# Patient Record
Sex: Female | Born: 1975 | Race: Asian | Hispanic: No | Marital: Married | Smoking: Never smoker
Health system: Southern US, Community
[De-identification: ages and names within clinical notes are randomized; demographics above are authoritative.]

## PROBLEM LIST (undated history)

## (undated) DIAGNOSIS — R202 Paresthesia of skin: Secondary | ICD-10-CM

## (undated) DIAGNOSIS — K219 Gastro-esophageal reflux disease without esophagitis: Secondary | ICD-10-CM

## (undated) DIAGNOSIS — E559 Vitamin D deficiency, unspecified: Secondary | ICD-10-CM

## (undated) DIAGNOSIS — R55 Syncope and collapse: Secondary | ICD-10-CM

## (undated) DIAGNOSIS — N6019 Diffuse cystic mastopathy of unspecified breast: Secondary | ICD-10-CM

## (undated) DIAGNOSIS — E039 Hypothyroidism, unspecified: Secondary | ICD-10-CM

## (undated) DIAGNOSIS — R87619 Unspecified abnormal cytological findings in specimens from cervix uteri: Secondary | ICD-10-CM

## (undated) DIAGNOSIS — I10 Essential (primary) hypertension: Secondary | ICD-10-CM

## (undated) DIAGNOSIS — K633 Ulcer of intestine: Secondary | ICD-10-CM

## (undated) DIAGNOSIS — M5481 Occipital neuralgia: Secondary | ICD-10-CM

## (undated) DIAGNOSIS — M62838 Other muscle spasm: Secondary | ICD-10-CM

## (undated) HISTORY — DX: Syncope and collapse: R55

## (undated) HISTORY — DX: Occipital neuralgia: M54.81

## (undated) HISTORY — DX: Gastro-esophageal reflux disease without esophagitis: K21.9

## (undated) HISTORY — DX: Vitamin D deficiency, unspecified: E55.9

## (undated) HISTORY — DX: Unspecified abnormal cytological findings in specimens from cervix uteri: R87.619

## (undated) HISTORY — PX: WISDOM TOOTH EXTRACTION: SHX21

## (undated) HISTORY — DX: Paresthesia of skin: R20.2

## (undated) HISTORY — PX: ESOPHAGOGASTRODUODENOSCOPY: SHX1529

## (undated) HISTORY — DX: Diffuse cystic mastopathy of unspecified breast: N60.19

## (undated) HISTORY — DX: Other muscle spasm: M62.838

## (undated) HISTORY — DX: Ulcer of intestine: K63.3

## (undated) HISTORY — PX: BREAST SURGERY: SHX581

---

## 1997-10-10 ENCOUNTER — Encounter: Admission: RE | Admit: 1997-10-10 | Discharge: 1998-01-08 | Payer: Self-pay | Admitting: *Deleted

## 1999-06-04 ENCOUNTER — Encounter: Payer: Self-pay | Admitting: Otolaryngology

## 1999-06-04 ENCOUNTER — Encounter: Admission: RE | Admit: 1999-06-04 | Discharge: 1999-06-04 | Payer: Self-pay | Admitting: Otolaryngology

## 1999-06-11 ENCOUNTER — Other Ambulatory Visit: Admission: RE | Admit: 1999-06-11 | Discharge: 1999-06-11 | Payer: Self-pay | Admitting: *Deleted

## 2000-01-22 ENCOUNTER — Other Ambulatory Visit: Admission: RE | Admit: 2000-01-22 | Discharge: 2000-01-22 | Payer: Self-pay | Admitting: *Deleted

## 2000-04-08 ENCOUNTER — Ambulatory Visit (HOSPITAL_COMMUNITY): Admission: RE | Admit: 2000-04-08 | Discharge: 2000-04-08 | Payer: Self-pay | Admitting: *Deleted

## 2000-04-08 ENCOUNTER — Encounter: Payer: Self-pay | Admitting: *Deleted

## 2000-07-19 ENCOUNTER — Inpatient Hospital Stay (HOSPITAL_COMMUNITY): Admission: AD | Admit: 2000-07-19 | Discharge: 2000-07-21 | Payer: Self-pay | Admitting: Obstetrics and Gynecology

## 2002-10-09 ENCOUNTER — Other Ambulatory Visit: Admission: RE | Admit: 2002-10-09 | Discharge: 2002-10-09 | Payer: Self-pay | Admitting: Obstetrics and Gynecology

## 2003-08-15 ENCOUNTER — Emergency Department (HOSPITAL_COMMUNITY): Admission: EM | Admit: 2003-08-15 | Discharge: 2003-08-16 | Payer: Self-pay | Admitting: Emergency Medicine

## 2003-11-06 ENCOUNTER — Other Ambulatory Visit: Admission: RE | Admit: 2003-11-06 | Discharge: 2003-11-06 | Payer: Self-pay | Admitting: Obstetrics and Gynecology

## 2004-11-06 ENCOUNTER — Other Ambulatory Visit: Admission: RE | Admit: 2004-11-06 | Discharge: 2004-11-06 | Payer: Self-pay | Admitting: Obstetrics and Gynecology

## 2005-12-01 ENCOUNTER — Other Ambulatory Visit: Admission: RE | Admit: 2005-12-01 | Discharge: 2005-12-01 | Payer: Self-pay | Admitting: Family Medicine

## 2007-02-03 ENCOUNTER — Other Ambulatory Visit: Admission: RE | Admit: 2007-02-03 | Discharge: 2007-02-03 | Payer: Self-pay | Admitting: Family Medicine

## 2008-01-11 ENCOUNTER — Other Ambulatory Visit: Admission: RE | Admit: 2008-01-11 | Discharge: 2008-01-11 | Payer: Self-pay | Admitting: Family Medicine

## 2009-06-11 ENCOUNTER — Other Ambulatory Visit: Admission: RE | Admit: 2009-06-11 | Discharge: 2009-06-11 | Payer: Self-pay | Admitting: Family Medicine

## 2009-07-05 ENCOUNTER — Emergency Department (HOSPITAL_BASED_OUTPATIENT_CLINIC_OR_DEPARTMENT_OTHER): Admission: EM | Admit: 2009-07-05 | Discharge: 2009-07-06 | Payer: Self-pay | Admitting: Emergency Medicine

## 2009-08-21 ENCOUNTER — Ambulatory Visit (HOSPITAL_COMMUNITY): Admission: RE | Admit: 2009-08-21 | Discharge: 2009-08-21 | Payer: Self-pay | Admitting: Gastroenterology

## 2009-08-25 ENCOUNTER — Observation Stay (HOSPITAL_COMMUNITY): Admission: EM | Admit: 2009-08-25 | Discharge: 2009-08-27 | Payer: Self-pay | Admitting: Emergency Medicine

## 2010-05-06 IMAGING — NM NM HEPATO W/GB/PHARM/[PERSON_NAME]
3 series · 13 of 13 positions shown · non-contrast
Comparison: [HOSPITAL] abdominal ultrasound 08/21/2009.

CLINICAL DATA: Abdominal pain.

NUCLEAR MEDICINE HEPATOBILIARY IMAGING WITH GALLBLADDER EF
TECHNIQUE: Sequential images of the abdomen were obtained [DATE] minutes following intravenous administration of
radiopharmaceutical.  After slow intravenous infusion of 1.04 uCg
Cholecystokinin, gallbladder ejection fraction was determined.
Radiopharmaceutical:  5.0 mCi Gc-OOm Choletec

[he hepatobiliary · 3.43mm/px · 6 of 48 frames shown (1 of 3)]
[frame 5/48]
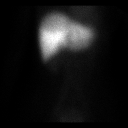
[frame 13/48]
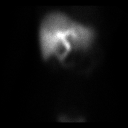
[frame 21/48]
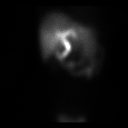
[frame 29/48]
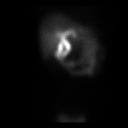
[frame 37/48]
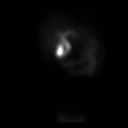
[frame 45/48]
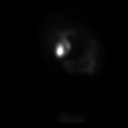

[he hepatobiliary · 3.43mm/px · 6 of 30 frames shown (2 of 3)]
[frame 3/30]
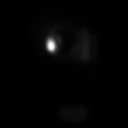
[frame 8/30]
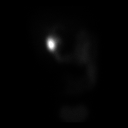
[frame 13/30]
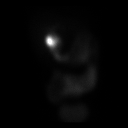
[frame 18/30]
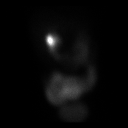
[frame 23/30]
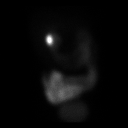
[frame 28/30]
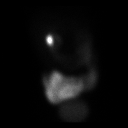

[he hepatobiliary · 1 of 1 slices shown (3 of 3)]
[im 1/1]
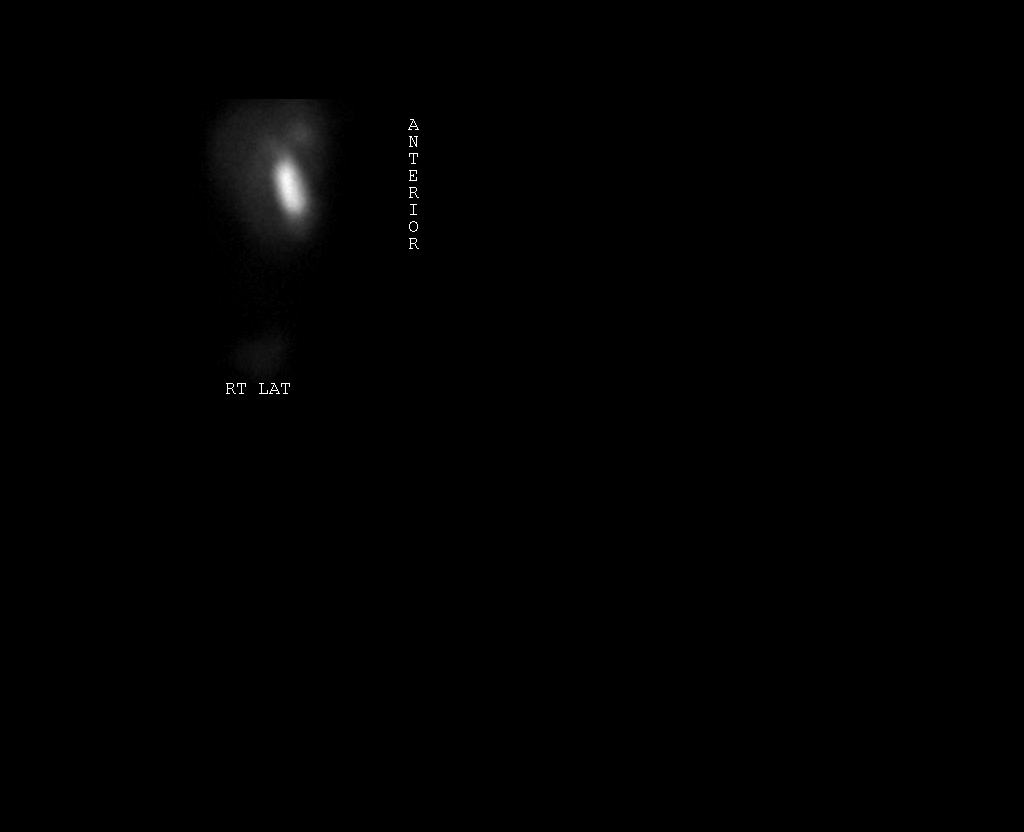

[13 of 13 positions shown; findings below may reference images not displayed]

FINDINGS: Normal hepatic uptake and excretion of tracer is seen
with gallbladder and small bowel activity visualized at 15 minutes
delay and throughout study.  Following CCK infusion gallbladder
ejection fraction is 76.2% at 30 minutes delay (normal greater than
30%).

The patient did not experience symptoms during CCK infusion.
IMPRESSION: Normal.

## 2010-05-06 IMAGING — US US ABDOMEN COMPLETE
1 series · 14 of 25 positions shown · non-contrast
Comparison: None.

CLINICAL DATA: Abdominal pain.

COMPLETE ABDOMINAL ULTRASOUND

[Series 1: us abdomen complete · 0.22mm/px · 14 of 73 slices shown]
[im 1/73]
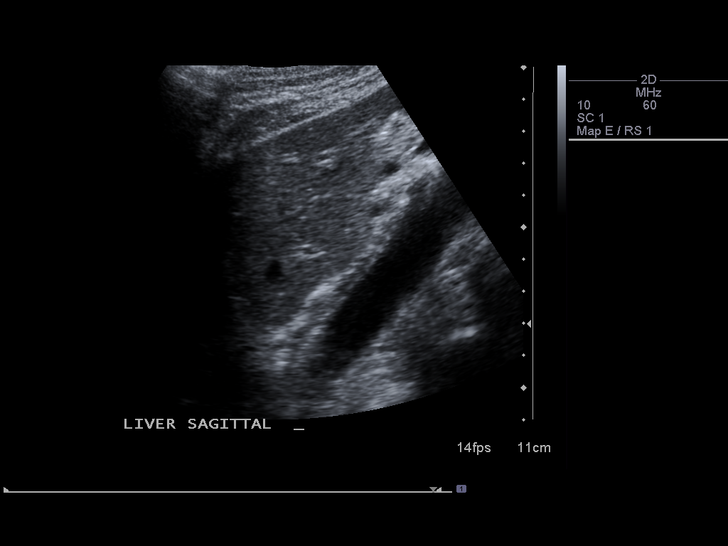
[im 7/73]
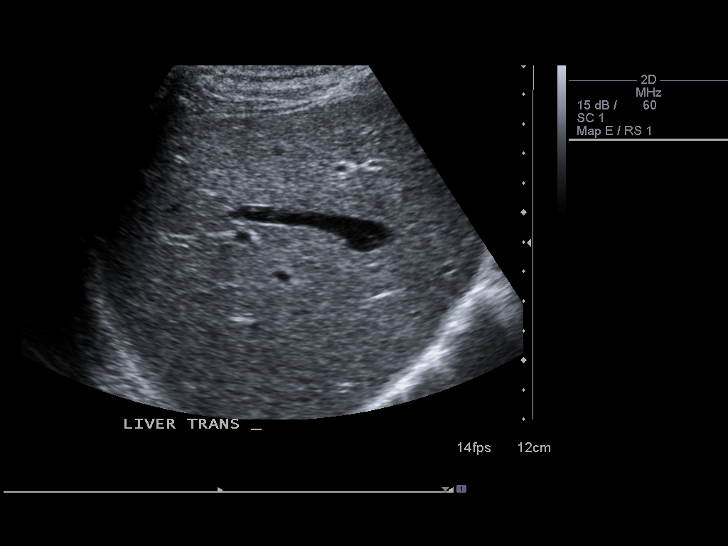
[im 13/73]
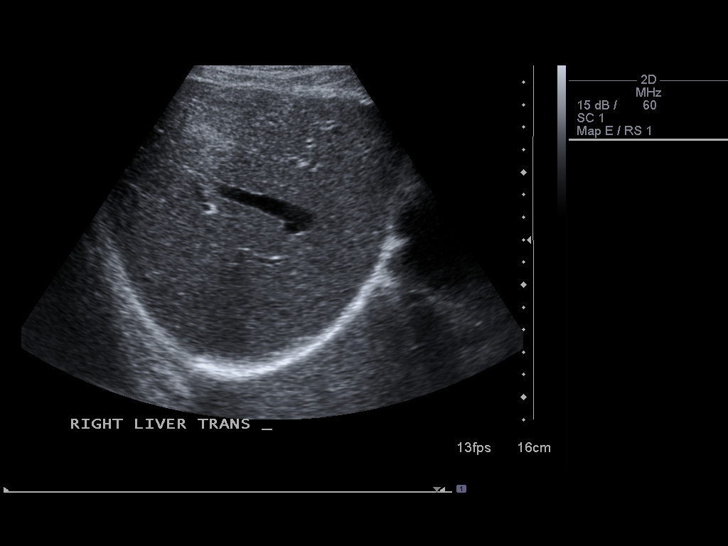
[im 19/73]
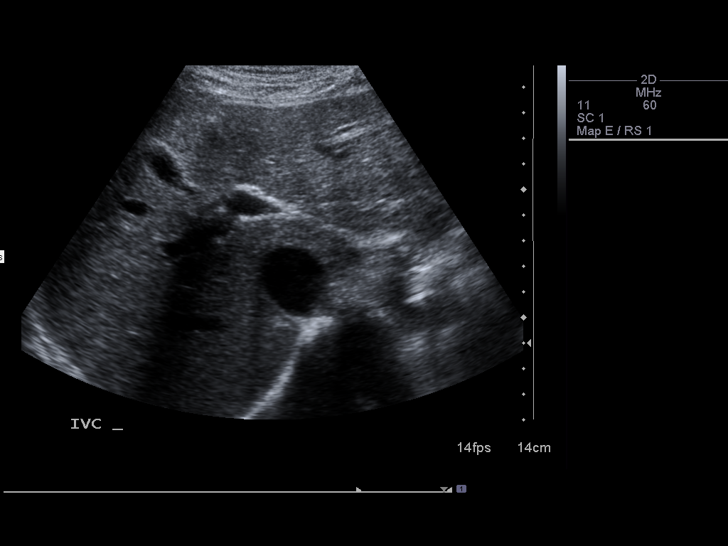
[im 25/73]
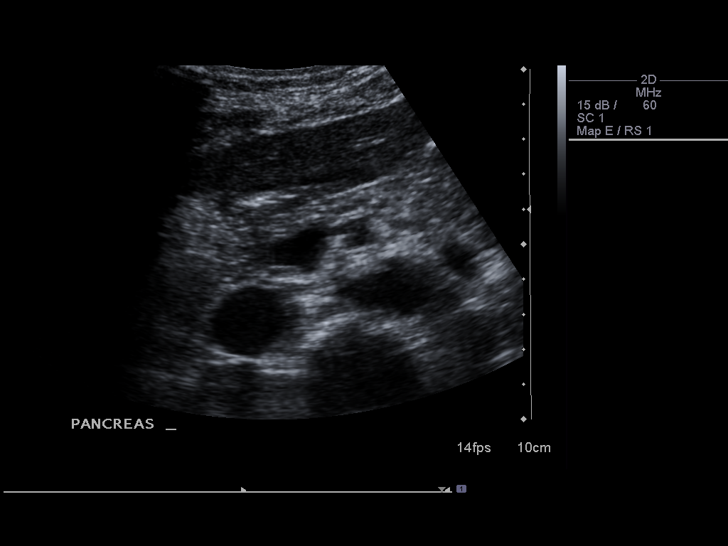
[im 28/73]
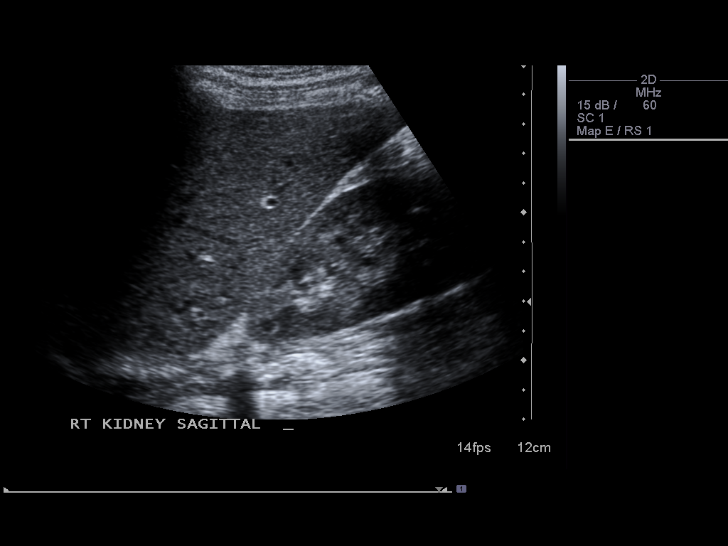
[im 34/73]
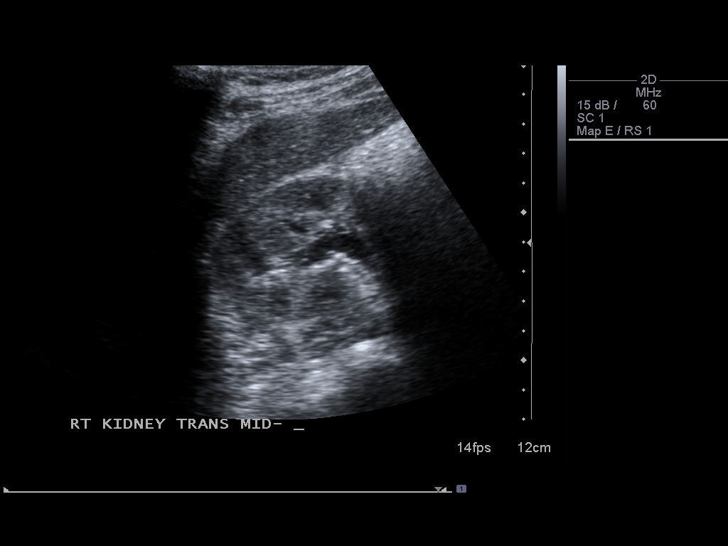
[im 40/73]
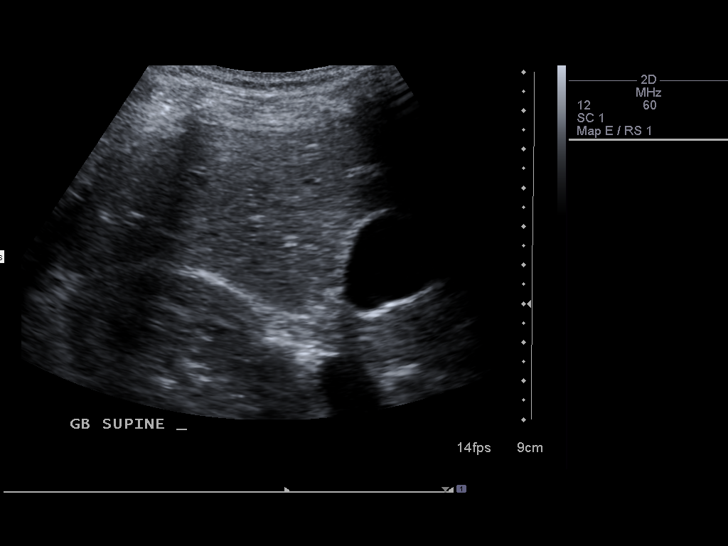
[im 46/73]
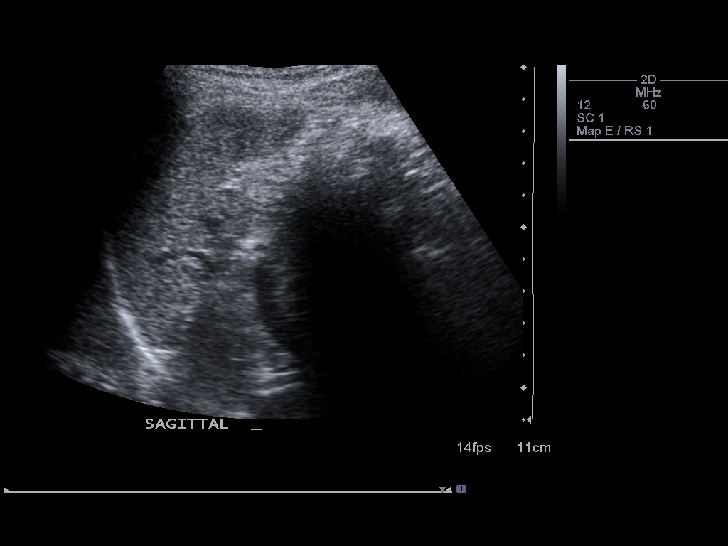
[im 49/73]
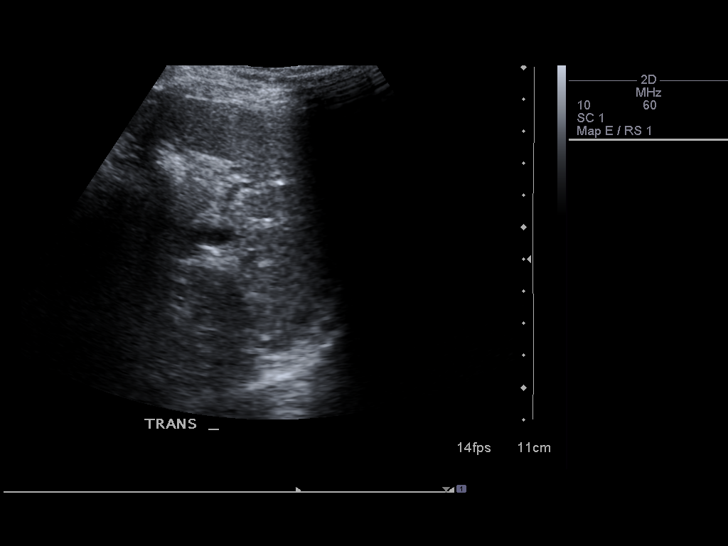
[im 55/73]
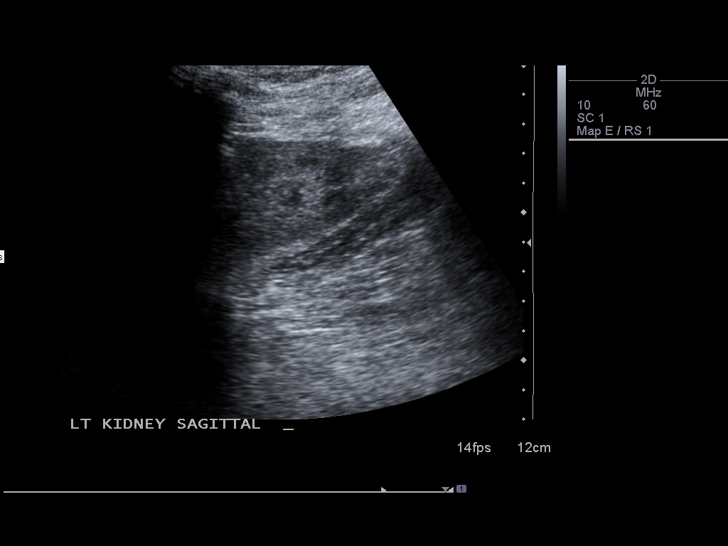
[im 61/73]
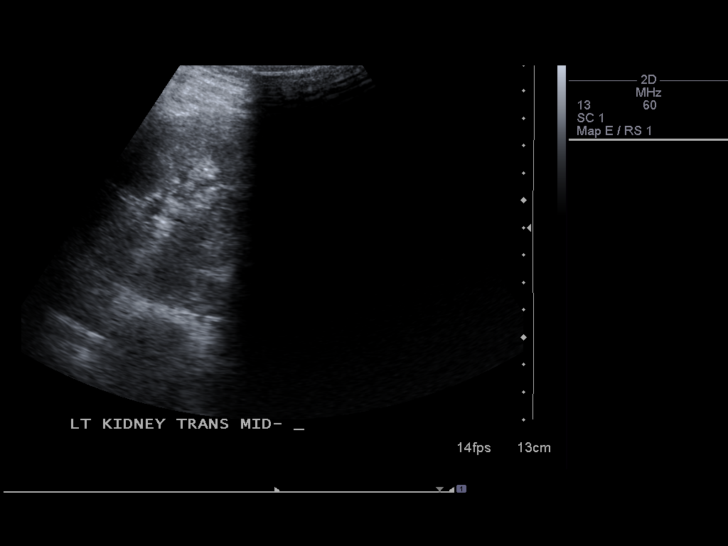
[im 67/73]
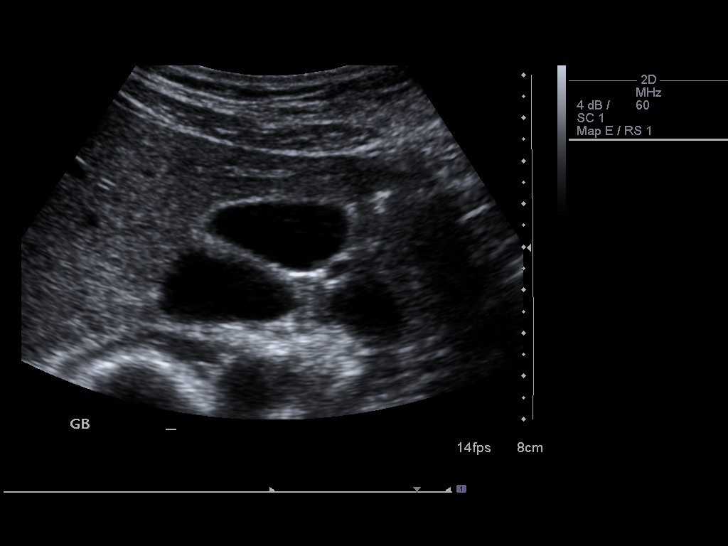
[im 73/73]
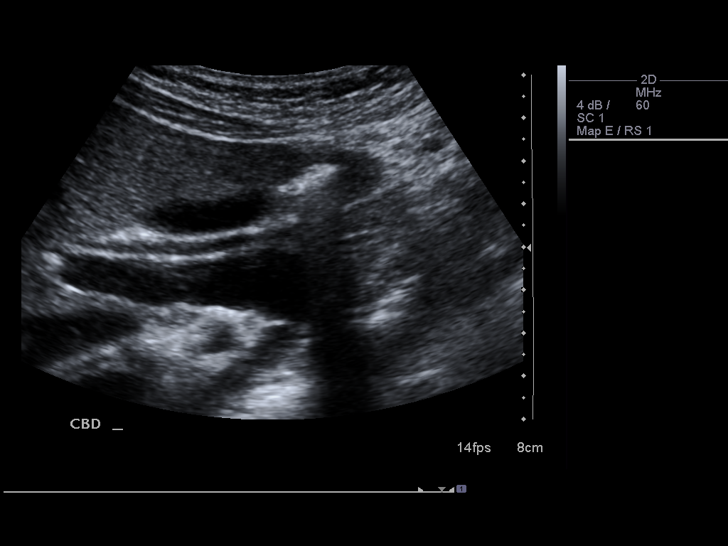

[14 of 25 positions shown; findings below may reference images not displayed]

FINDINGS: Gallbladder:  Sonographically normal without sludge or gallstones
with normal 2 mm wall thickness.

Common bile duct:  No dilated intrahepatic or extrahepatic bile
ducts with common bile duct measuring normally at 4 mm.

Liver:  Normal in size with normal echotexture with no focal lesion
identified.

IVC:  Appears normal.

Pancreas:  No focal abnormality seen.

Spleen:  Sonographically normal measuring 5.6 cm long.

Right Kidney:  Sonographically normal measuring 11.1 cm long.

Left Kidney:  Sonographically normal measuring 10.7 cm long.

Abdominal aorta:  No aneurysm identified with maximum proximal
diameter 2.2 cm.

No free fluid seen.
IMPRESSION: Normal.

## 2010-06-12 ENCOUNTER — Other Ambulatory Visit (HOSPITAL_COMMUNITY)
Admission: RE | Admit: 2010-06-12 | Discharge: 2010-06-12 | Disposition: A | Discharge status: Home / Self Care | Payer: BC Managed Care – PPO | Source: Ambulatory Visit | Attending: Family Medicine | Admitting: Family Medicine

## 2010-06-12 ENCOUNTER — Other Ambulatory Visit: Payer: Self-pay | Admitting: Family Medicine

## 2010-06-12 DIAGNOSIS — Z113 Encounter for screening for infections with a predominantly sexual mode of transmission: Secondary | ICD-10-CM | POA: Insufficient documentation

## 2010-06-12 DIAGNOSIS — Z0000 Encounter for general adult medical examination without abnormal findings: Secondary | ICD-10-CM | POA: Insufficient documentation

## 2010-07-21 LAB — CBC
HCT: 33.5 % — ABNORMAL LOW (ref 36.0–46.0)
HCT: 39 % (ref 36.0–46.0)
Hemoglobin: 11 g/dL — ABNORMAL LOW (ref 12.0–15.0)
Hemoglobin: 11.1 g/dL — ABNORMAL LOW (ref 12.0–15.0)
Hemoglobin: 12.9 g/dL (ref 12.0–15.0)
MCHC: 32.7 g/dL (ref 30.0–36.0)
MCHC: 34 g/dL (ref 30.0–36.0)
MCV: 98.1 fL (ref 78.0–100.0)
RBC: 3.37 MIL/uL — ABNORMAL LOW (ref 3.87–5.11)
RBC: 3.98 MIL/uL (ref 3.87–5.11)
RDW: 12.7 % (ref 11.5–15.5)
RDW: 13 % (ref 11.5–15.5)
WBC: 5.9 10*3/uL (ref 4.0–10.5)

## 2010-07-21 LAB — TYPE AND SCREEN

## 2010-07-21 LAB — BASIC METABOLIC PANEL
Calcium: 7.9 mg/dL — ABNORMAL LOW (ref 8.4–10.5)
GFR calc Af Amer: >60 mL/min (ref >60)
GFR calc non Af Amer: >60 mL/min (ref >60)
Sodium: 136 mEq/L (ref 135–145)

## 2010-07-21 LAB — COMPREHENSIVE METABOLIC PANEL
ALT: 16 U/L (ref 0–35)
AST: 20 U/L (ref 0–37)
CO2: 25 mEq/L (ref 19–32)
Chloride: 106 mEq/L (ref 96–112)
Creatinine, Ser: 0.59 mg/dL (ref 0.4–1.2)
GFR calc Af Amer: >60 mL/min (ref >60)
GFR calc non Af Amer: >60 mL/min (ref >60)
Glucose, Bld: 89 mg/dL (ref 70–99)
Total Bilirubin: 0.7 mg/dL (ref 0.3–1.2)

## 2010-07-21 LAB — URINALYSIS, ROUTINE W REFLEX MICROSCOPIC
Bilirubin Urine: NEGATIVE
Glucose, UA: NEGATIVE mg/dL
Hgb urine dipstick: NEGATIVE
pH: 7.5 (ref 5.0–8.0)

## 2010-07-27 LAB — CBC
Hemoglobin: 13.7 g/dL (ref 12.0–15.0)
MCHC: 34.2 g/dL (ref 30.0–36.0)
MCV: 97.3 fL (ref 78.0–100.0)
RBC: 4.13 MIL/uL (ref 3.87–5.11)

## 2010-07-27 LAB — URINE CULTURE
Colony Count: NO GROWTH
Culture: NO GROWTH

## 2010-07-27 LAB — POCT I-STAT, CHEM 8
Calcium, Ion: 1.16 mmol/L (ref 1.12–1.32)
Hemoglobin: 14.6 g/dL (ref 12.0–15.0)
Sodium: 141 mEq/L (ref 135–145)
TCO2: 25 mmol/L (ref 0–100)

## 2010-07-27 LAB — COMPREHENSIVE METABOLIC PANEL
ALT: 16 U/L (ref 0–35)
CO2: 23 mEq/L (ref 19–32)
Calcium: 6.3 mg/dL — CL (ref 8.4–10.5)
Creatinine, Ser: 0.6 mg/dL (ref 0.4–1.2)
GFR calc non Af Amer: >60 mL/min (ref >60)
Glucose, Bld: 78 mg/dL (ref 70–99)
Sodium: 146 mEq/L — ABNORMAL HIGH (ref 135–145)
Total Bilirubin: 0.8 mg/dL (ref 0.3–1.2)

## 2010-07-27 LAB — URINALYSIS, ROUTINE W REFLEX MICROSCOPIC
Ketones, ur: >80 mg/dL — AB
Nitrite: NEGATIVE
Protein, ur: 30 mg/dL — AB
Specific Gravity, Urine: 1.027 (ref 1.005–1.030)
Urobilinogen, UA: 0.2 mg/dL (ref 0.0–1.0)

## 2010-07-27 LAB — PREGNANCY, URINE: Preg Test, Ur: NEGATIVE

## 2010-07-27 LAB — LIPASE, BLOOD: Lipase: 80 U/L (ref 23–300)

## 2010-07-27 LAB — DIFFERENTIAL
Basophils Absolute: 0.1 10*3/uL (ref 0.0–0.1)
Eosinophils Absolute: 0 10*3/uL (ref 0.0–0.7)
Lymphs Abs: 0.5 10*3/uL — ABNORMAL LOW (ref 0.7–4.0)
Neutrophils Relative %: 91 % — ABNORMAL HIGH (ref 43–77)
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
## 2010-11-24 ENCOUNTER — Other Ambulatory Visit (HOSPITAL_COMMUNITY): Payer: Self-pay | Admitting: Gastroenterology

## 2010-12-07 ENCOUNTER — Encounter (HOSPITAL_COMMUNITY)
Admission: RE | Admit: 2010-12-07 | Discharge: 2010-12-07 | Disposition: A | Discharge status: Home / Self Care | Payer: BC Managed Care – PPO | Source: Ambulatory Visit | Attending: Gastroenterology | Admitting: Gastroenterology

## 2010-12-07 DIAGNOSIS — R1013 Epigastric pain: Secondary | ICD-10-CM | POA: Insufficient documentation

## 2010-12-07 DIAGNOSIS — K3189 Other diseases of stomach and duodenum: Secondary | ICD-10-CM | POA: Insufficient documentation

## 2010-12-07 DIAGNOSIS — R112 Nausea with vomiting, unspecified: Secondary | ICD-10-CM | POA: Insufficient documentation

## 2010-12-07 DIAGNOSIS — R109 Unspecified abdominal pain: Secondary | ICD-10-CM | POA: Insufficient documentation

## 2010-12-07 MED ORDER — TECHNETIUM TC 99M SULFUR COLLOID
2.0000 | Freq: Once | INTRAVENOUS | Status: AC | PRN
  Administered 2010-12-07: 2 via INTRAVENOUS

## 2010-12-21 ENCOUNTER — Encounter: Payer: Self-pay | Admitting: Internal Medicine

## 2010-12-31 ENCOUNTER — Encounter: Payer: Self-pay | Admitting: Internal Medicine

## 2011-01-01 ENCOUNTER — Ambulatory Visit (INDEPENDENT_AMBULATORY_CARE_PROVIDER_SITE_OTHER): Payer: BC Managed Care – PPO | Admitting: Internal Medicine

## 2011-01-01 ENCOUNTER — Encounter: Payer: Self-pay | Admitting: Internal Medicine

## 2011-01-01 ENCOUNTER — Encounter: Payer: Self-pay | Admitting: *Deleted

## 2011-01-01 VITALS — BP 132/82 | HR 59 | Ht 59.5 in | Wt 115.0 lb

## 2011-01-01 VITALS — BP 128/89 | HR 57

## 2011-01-01 DIAGNOSIS — R358 Other polyuria: Secondary | ICD-10-CM

## 2011-01-01 DIAGNOSIS — R55 Syncope and collapse: Secondary | ICD-10-CM | POA: Insufficient documentation

## 2011-01-01 DIAGNOSIS — R000 Tachycardia, unspecified: Secondary | ICD-10-CM

## 2011-01-01 DIAGNOSIS — R002 Palpitations: Secondary | ICD-10-CM

## 2011-01-01 DIAGNOSIS — R3589 Other polyuria: Secondary | ICD-10-CM | POA: Insufficient documentation

## 2011-01-01 NOTE — Assessment & Plan Note (Signed)
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 She also has a description of a documented tachycardia which could potentially serve as a trigger. To that end and event recorder will be necessary to clarify that.  We discussed the physiology of neurocardiogenic syncope. Her epiphenomena are consistent with this. She has orthostatic intolerance and shower intolerance. Interestingly she also describes polyuria and I wonder whether there may not be something metabolic going on whereby she is chronically volume deplete. All after followup with her PCP about this.  Given the amount of volume that she takes in she may be choronically volume deplete

## 2011-01-01 NOTE — Assessment & Plan Note (Signed)
As above.

## 2011-01-01 NOTE — Patient Instructions (Signed)
Your physician recommends that you schedule a follow-up appointment in: 2 months per Dr. Graciela Husbands   Your physician has recommended that you wear an event monitor DX 785.0 . Event monitors are medical devices that record the heart's electrical activity. Doctors most often [place] these monitors to diagnose arrhythmias. Arrhythmias are problems with the speed or rhythm of the heartbeat. The monitor is a small, portable device. You can wear one while you do your normal daily activities. This is usually used to diagnose what is causing palpitations/syncope (passing out).  Your physician has recommended you make the following change in your medication: START THERMOTABS OTC

## 2011-01-01 NOTE — Assessment & Plan Note (Signed)
These either represent tachycardia or the adrenergic surge and sinus tachycardia that precedes sympathetic withdrawal

## 2011-01-01 NOTE — Progress Notes (Signed)
HPI: Carolyn Mccall is a 35 y.o. female Seen at the request of Dr. Cliffton Asters because of recurrent syncope.  She has a long-standing history of syncope and presyncope. The first episode that she recalls occurred while she was being seen vigorously with her 34-year-old daughter 17 years ago. She has had syncope most recently while in the dentist's office when they were doing dental work with incomplete local anesthesia.  At that time apparently she was told her heart rate was 300 or so.   Many of her spells artifact associated with tachypalpitations.  She also has a history of shower and tolerance and heat and tolerance as well as orthostatic intolerance. This has been true for a long time. Her diet is salt deplete as well as fluid deplete.  She admits to significant amount of psychosocial stress.    She denies the use of marijuana or significant alcohol.  She apparently drinks a great deal. She urinates frequently and she says high volumes. This has not yet been investigated   Current Outpatient Prescriptions  Medication Sig Dispense Refill  . dexlansoprazole (DEXILANT) 60 MG capsule Take 60 mg by mouth daily.        Marland Kitchen ibuprofen (ADVIL,MOTRIN) 200 MG tablet Take 200 mg by mouth every 6 (six) hours as needed.        Marland Kitchen levonorgestrel (MIRENA) 20 MCG/24HR IUD 1 each by Intrauterine route once. As directed         No Known Allergies  Past Medical History  Diagnosis Date  . Syncope   . Migraine   . Fibrocystic breast   . Vitamin D deficiency   . Acid reflux     No past surgical history on file.  No family history on file.  History   Social History  . Marital Status: Married    Spouse Name: N/A    Number of Children: N/A  . Years of Education: N/A   Occupational History  . Not on file.   Social History Main Topics  . Smoking status: Never Smoker   . Smokeless tobacco: Not on file  . Alcohol Use: No  . Drug Use: No  . Sexually Active: Not on file   Other Topics Concern  .  Not on file   Social History Narrative  . No narrative on file    Fourteen point review of systems was negative except as noted in HPI and PMH   PHYSICAL EXAMINATION  There were no vitals taken for this visit.   Well developed and nourished young Asian woman appearing her stated age n no acute distress HENT normal Neck supple with JVP-flat Carotids brisk and full without bruits Back without scoliosis or kyphosis Clear Regular rate and rhythm, no murmurs or gallops Abd-soft with active BS without hepatomegaly or midline pulsation Femoral pulses 2+ distal pulses intact No Clubbing cyanosis edema Skin-warm and dry LN-neg submandibular and supraclavicular A & Oriented CN 3-12 normal  Grossly normal sensory and motor function Affect engaging .

## 2011-01-12 ENCOUNTER — Encounter (INDEPENDENT_AMBULATORY_CARE_PROVIDER_SITE_OTHER): Payer: BC Managed Care – PPO

## 2011-01-12 DIAGNOSIS — I471 Supraventricular tachycardia: Secondary | ICD-10-CM

## 2011-02-18 ENCOUNTER — Telehealth: Payer: Self-pay | Admitting: *Deleted

## 2011-02-18 NOTE — Telephone Encounter (Signed)
The patient is aware of her monitor results.

## 2011-03-23 ENCOUNTER — Ambulatory Visit (INDEPENDENT_AMBULATORY_CARE_PROVIDER_SITE_OTHER): Payer: BC Managed Care – PPO | Admitting: Internal Medicine

## 2011-03-23 ENCOUNTER — Encounter: Payer: Self-pay | Admitting: Internal Medicine

## 2011-03-23 DIAGNOSIS — R3589 Other polyuria: Secondary | ICD-10-CM

## 2011-03-23 DIAGNOSIS — R358 Other polyuria: Secondary | ICD-10-CM

## 2011-03-23 DIAGNOSIS — R55 Syncope and collapse: Secondary | ICD-10-CM

## 2011-03-23 DIAGNOSIS — R002 Palpitations: Secondary | ICD-10-CM

## 2011-03-23 NOTE — Assessment & Plan Note (Signed)
I suspect these are PVCs and the residual fatigue as a dysautonomic response to them. Unfortunately her event recorder did not clarify this for [place]

## 2011-03-23 NOTE — Assessment & Plan Note (Signed)
We reviewed the physiology again. We discussed the alternatives of Florinef versus salt supplementation versus measuring salt excretion. We will try to middle-As she has had such a striking benefit so far to table salt addition  In the event that the heat brings worsening symptoms, we will consider the addition of Florinef

## 2011-03-23 NOTE — Progress Notes (Signed)
  HPI  Carolyn Mccall is a 35 y.o. 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 She is less lightheadedness. She has increased her sodium intake; she decided not to take the thermo tabs salt supplements.  She also has episodes where she feels like her heart flutters for just a moment and then is left with significant residual fatigue for about 10 or 15 minutes  Past Medical History  Diagnosis Date  . Syncope   . Migraine   . Fibrocystic breast   . Vitamin D deficiency   . Acid reflux     No past surgical history on file.  Current Outpatient Prescriptions  Medication Sig Dispense Refill  . acetaminophen (TYLENOL) 325 MG tablet Take 650 mg by mouth every 6 (six) hours as needed.        Marland Kitchen dexlansoprazole (DEXILANT) 60 MG capsule Take 60 mg by mouth daily.        Marland Kitchen levonorgestrel (MIRENA) 20 MCG/24HR IUD 1 each by Intrauterine route once. As directed         No Known Allergies  Review of Systems negative except from HPI and PMH  Physical Exam Well developed and well nourished in no acute distress HENT normal E scleral and icterus clear Neck Supple JVP flat; carotids brisk and full Clear to ausculation Regular rate and rhythm, no murmurs gallops or rub Soft with active bowel sounds No clubbing cyanosis and edema Alert and oriented, grossly normal motor and sensory function Skin Warm and Dry    Assessment and  Plan

## 2011-03-23 NOTE — Assessment & Plan Note (Signed)
She also has some dysuria and I suggested she follow up with her PCP

## 2011-03-23 NOTE — Patient Instructions (Signed)
Your physician wants you to follow-up in: 8 months (July 2013) with Dr. Graciela Husbands. You will receive a reminder letter in the mail two months in advance. If you don't receive a letter, please call our office to schedule the follow-up appointment.  Your physician recommends that you continue on your current medications as directed. Please refer to the Current Medication list given to you today.

## 2011-06-17 ENCOUNTER — Other Ambulatory Visit: Payer: Self-pay | Admitting: Family Medicine

## 2011-06-17 ENCOUNTER — Telehealth: Payer: Self-pay | Admitting: Internal Medicine

## 2011-06-17 ENCOUNTER — Other Ambulatory Visit (HOSPITAL_COMMUNITY)
Admission: RE | Admit: 2011-06-17 | Discharge: 2011-06-17 | Disposition: A | Discharge status: Home / Self Care | Payer: BC Managed Care – PPO | Source: Ambulatory Visit | Attending: Family Medicine | Admitting: Family Medicine

## 2011-06-17 DIAGNOSIS — R87619 Unspecified abnormal cytological findings in specimens from cervix uteri: Secondary | ICD-10-CM | POA: Insufficient documentation

## 2011-06-17 NOTE — Telephone Encounter (Signed)
LOV,Monotor,12 faxed to [facility] @ [phone] 06/17/11/KM

## 2011-08-15 ENCOUNTER — Encounter (HOSPITAL_COMMUNITY): Payer: Self-pay | Admitting: Nurse Practitioner

## 2011-08-15 ENCOUNTER — Emergency Department (HOSPITAL_COMMUNITY)
Admission: EM | Admit: 2011-08-15 | Discharge: 2011-08-15 | Disposition: A | Discharge status: Home / Self Care | Payer: BC Managed Care – PPO | Attending: Emergency Medicine | Admitting: Emergency Medicine

## 2011-08-15 DIAGNOSIS — R55 Syncope and collapse: Secondary | ICD-10-CM

## 2011-08-15 DIAGNOSIS — I498 Other specified cardiac arrhythmias: Secondary | ICD-10-CM | POA: Insufficient documentation

## 2011-08-15 DIAGNOSIS — R002 Palpitations: Secondary | ICD-10-CM | POA: Insufficient documentation

## 2011-08-15 DIAGNOSIS — R079 Chest pain, unspecified: Secondary | ICD-10-CM | POA: Insufficient documentation

## 2011-08-15 DIAGNOSIS — R209 Unspecified disturbances of skin sensation: Secondary | ICD-10-CM | POA: Insufficient documentation

## 2011-08-15 LAB — POCT I-STAT, CHEM 8
BUN: 6 mg/dL (ref 6–23)
Calcium, Ion: 1.15 mmol/L (ref 1.12–1.32)
Creatinine, Ser: 0.7 mg/dL (ref 0.50–1.10)
Hemoglobin: 12.9 g/dL (ref 12.0–15.0)
TCO2: 25 mmol/L (ref 0–100)

## 2011-08-15 LAB — CBC
HCT: 37.1 % (ref 36.0–46.0)
MCH: 32.1 pg (ref 26.0–34.0)
MCV: 94.6 fL (ref 78.0–100.0)
RDW: 12.1 % (ref 11.5–15.5)
WBC: 6.7 10*3/uL (ref 4.0–10.5)

## 2011-08-15 LAB — BASIC METABOLIC PANEL
BUN: 7 mg/dL (ref 6–23)
Calcium: 8.3 mg/dL — ABNORMAL LOW (ref 8.4–10.5)
Chloride: 105 mEq/L (ref 96–112)
Creatinine, Ser: 0.62 mg/dL (ref 0.50–1.10)
GFR calc Af Amer: >90 mL/min (ref >90)

## 2011-08-15 NOTE — ED Notes (Signed)
Per ems: pt called ems for headache, blurred vision, nasuea, chest pain, sweats, numbness to L side of body onset while at rest this afternoon. On arrival to scene, ems found pt in NSR with unremarkable 12 lead, administered 324 mg asa, started PIV. En route, VSS, A&Ox4. En route pt reports feeling better but mild headache and dizziness remains

## 2011-08-15 NOTE — Discharge Instructions (Signed)
Follow up with your doctor/cardiologist in coming week. Return to ER if worse, symptoms recur, fainting episodes, chest pain, severe headache, other concern.        Palpitations  A palpitation is the feeling that your heartbeat is irregular or is faster than normal. Although this is frightening, it usually is not serious. Palpitations may be caused by excesses of smoking, caffeine, or alcohol. They are also brought on by stress and anxiety. Sometimes, they are caused by heart disease. Unless otherwise noted, your caregiver did not find any signs of serious illness at this time. HOME CARE INSTRUCTIONS  To help prevent palpitations:  Drink decaffeinated coffee, tea, and soda pop. Avoid chocolate.   If you smoke or drink alcohol, quit or cut down as much as possible.   Reduce your stress or anxiety level. Biofeedback, yoga, or meditation will help you relax. Physical activity such as swimming, jogging, or walking also may be helpful.  SEEK MEDICAL CARE IF:   You continue to have a fast heartbeat.   Your palpitations occur more often.  SEEK IMMEDIATE MEDICAL CARE IF: You develop chest pain, shortness of breath, severe headache, dizziness, or fainting. Document Released: 04/16/2000 Document Revised: 04/08/2011 Document Reviewed: 06/16/2007 [facility] Patient Information 2012 [place], [place].

## 2011-08-15 NOTE — ED Notes (Signed)
No voiced complaints presently. NAD. Informed patient and/or family of status. Continues NSR on monitor. Denies CP, h/a, dizziness. States she just feels tired.

## 2011-08-15 NOTE — ED Provider Notes (Signed)
History     CSN: [id number]  Arrival date & time 08/15/11  1437   First MD Initiated Contact with Patient 08/15/11 1441      Chief Complaint  Patient presents with  . Chest Pain    (Consider location/radiation/quality/duration/timing/severity/associated sxs/prior treatment) Patient is a 36 y.o. female presenting with chest pain. The history is provided by the patient.  Chest Pain Primary symptoms include palpitations. Pertinent negatives for primary symptoms include no fever, no shortness of breath, no abdominal pain and no vomiting.  The palpitations did not occur with shortness of breath.   pt states had onset palpitations, feeling as if heart was racing. Then felt warm/flushed. Felt anxious. Then noted numbness/tingling hands and around chin/mouth. Also noted gradual onset mild headache, diffuse, dull, states not as severe as prior migraines. No neck pain or stiffness. No assoc unilateral numbness/weakness. No change in speech. Pt says eventually felt lightheaded as if may faint, but denies loc. States hx similar episodes in past-  Has seen cardiologist, Graciela Husbands,  - states no hx heart disease or dysrhythmia. Lasted several minutes. Feels fine now.     Past Medical History  Diagnosis Date  . Syncope   . Migraine   . Fibrocystic breast   . Vitamin d deficiency   . Acid reflux     History reviewed. No pertinent past surgical history.  History reviewed. No pertinent family history.  History  Substance Use Topics  . Smoking status: Never Smoker   . Smokeless tobacco: Not on file  . Alcohol Use: Yes     soCIAL    OB History    Grav Para Term Preterm Abortions TAB SAB Ect Mult Living                  Review of Systems  Constitutional: Negative for fever and chills.  HENT: Negative for congestion and neck pain.   Eyes: Negative for pain and redness.  Respiratory: Negative for shortness of breath.   Cardiovascular: Positive for palpitations. Negative for chest pain and  leg swelling.  Gastrointestinal: Negative for vomiting, abdominal pain and diarrhea.  Genitourinary: Negative for flank pain.  Musculoskeletal: Negative for back pain.  Skin: Negative for rash.  Neurological: Negative for headaches.  Hematological: Does not bruise/bleed easily.  Psychiatric/Behavioral: Negative for confusion.    Allergies  Review of patient's allergies indicates no known allergies.  Home Medications   Current Outpatient Rx  Name Route Sig Dispense Refill  . ACETAMINOPHEN 500 MG PO TABS Oral Take 1,000 mg by mouth every 6 (six) hours as needed. For pain    . DEXLANSOPRAZOLE 60 MG PO CPDR Oral Take 60 mg by mouth daily.      Marland Kitchen LEVONORGESTREL 20 MCG/24HR IU IUD Intrauterine 1 each by Intrauterine route once. As directed       BP 146/63  Pulse 59  Temp 97.9 F (36.6 C)  Resp 16  Ht 5' (1.524 m)  Wt 119 lb (53.978 kg)  BMI 23.24 kg/m2  SpO2 100%  Physical Exam  Nursing note and vitals reviewed. Constitutional: She is oriented to person, place, and time. She appears well-developed and well-nourished. No distress.  HENT:  Head: Atraumatic.  Nose: Nose normal.  Mouth/Throat: Oropharynx is clear and moist.       No sinus pain/tenderness.   Eyes: Conjunctivae are normal. Pupils are equal, round, and reactive to light. No scleral icterus.  Neck: Normal range of motion. Neck supple. No tracheal deviation present.  Cardiovascular: Normal rate,  regular rhythm, normal heart sounds and intact distal pulses.  Exam reveals no gallop and no friction rub.   No murmur heard. Pulmonary/Chest: Effort normal and breath sounds normal. No respiratory distress.  Abdominal: Soft. Normal appearance and bowel sounds are normal. She exhibits no distension. There is no tenderness.  Musculoskeletal: She exhibits no edema and no tenderness.  Neurological: She is alert and oriented to person, place, and time. No cranial nerve deficit.       Motor intact bil. Steady gait.   Skin: Skin  is warm and dry. No rash noted.  Psychiatric: She has a normal mood and affect.    ED Course  Procedures (including critical care time)   Results for orders placed during the hospital encounter of 08/15/11  CBC      Component Value Range   WBC 6.7  4.0 - 10.5 (K/uL)   RBC 3.92  3.87 - 5.11 (MIL/uL)   Hemoglobin 12.6  12.0 - 15.0 (g/dL)   HCT 16.1  09.6 - 04.5 (%)   MCV 94.6  78.0 - 100.0 (fL)   MCH 32.1  26.0 - 34.0 (pg)   MCHC 34.0  30.0 - 36.0 (g/dL)   RDW 40.9  81.1 - 91.4 (%)   Platelets 191  150 - 400 (K/uL)  POCT I-STAT, CHEM 8      Component Value Range   Sodium 141  135 - 145 (mEq/L)   Potassium 3.7  3.5 - 5.1 (mEq/L)   Chloride 107  96 - 112 (mEq/L)   BUN 6  6 - 23 (mg/dL)   Creatinine, Ser 7.82  0.50 - 1.10 (mg/dL)   Glucose, Bld 93  70 - 99 (mg/dL)   Calcium, Ion 9.56  2.13 - 1.32 (mmol/L)   TCO2 25  0 - 100 (mmol/L)   Hemoglobin 12.9  12.0 - 15.0 (g/dL)   HCT 08.6  57.8 - 46.9 (%)       MDM  Monitor, ecg. Labs. Currently asymptomatic.      Date: 08/15/2011  Rate: 58  Rhythm: sinus bradycardia  QRS Axis: normal  Intervals: normal  ST/T Wave abnormalities: nonspecific ST/T changes  Conduction Disutrbances:none  Narrative Interpretation:   Old EKG Reviewed: unchanged  Labs normal. Remains in nsr on monitor, no dysrhythmia.  Pt has had extensive cardiology eval in past, including holters - they feel possibly neurocardiogenic near syncope/syncope.  Pt stable for d/c, will have f/u closely with her doctors.     Suzi Roots, MD 08/15/11 [phone]

## 2011-09-07 ENCOUNTER — Ambulatory Visit: Payer: BC Managed Care – PPO | Admitting: Physician Assistant

## 2011-09-09 ENCOUNTER — Ambulatory Visit: Payer: BC Managed Care – PPO | Admitting: Physician Assistant

## 2011-09-10 ENCOUNTER — Ambulatory Visit (INDEPENDENT_AMBULATORY_CARE_PROVIDER_SITE_OTHER): Payer: BC Managed Care – PPO | Admitting: Physician Assistant

## 2011-09-10 ENCOUNTER — Encounter: Payer: Self-pay | Admitting: Physician Assistant

## 2011-09-10 VITALS — BP 145/92 | HR 64 | Ht 60.0 in | Wt 116.8 lb

## 2011-09-10 DIAGNOSIS — R002 Palpitations: Secondary | ICD-10-CM

## 2011-09-10 DIAGNOSIS — R55 Syncope and collapse: Secondary | ICD-10-CM

## 2011-09-10 DIAGNOSIS — IMO0001 Reserved for inherently not codable concepts without codable children: Secondary | ICD-10-CM

## 2011-09-10 DIAGNOSIS — R030 Elevated blood-pressure reading, without diagnosis of hypertension: Secondary | ICD-10-CM

## 2011-09-10 NOTE — Patient Instructions (Signed)
Your physician has requested that you have an echocardiogram DX PALPITATIONS, SYNCOPE. Echocardiography is a painless test that uses sound waves to create images of your heart. It provides your doctor with information about the size and shape of your heart and how well your heart's chambers and valves are working. This procedure takes approximately one hour. There are no restrictions for this procedure.  Your physician has requested that you have a stress echocardiogram DX PALPITATIONS, SYNCOPE.. For further information please visit [email or web address] Please follow instruction sheet as given.   Your physician recommends that you schedule a follow-up appointment in: 3-4 WEEKS WITH DR. Graciela Husbands

## 2011-09-10 NOTE — Progress Notes (Signed)
[address] [place], [place]  [address] Phone: [phone] Fax:  [phone]  Date:  09/10/2011   Name:  Carolyn Mccall   DOB:  1975-05-19   MRN:  [id number]  PCP:  Cala Bradford, MD, MD  Primary Cardiologist/Primary Electrophysiologist:  Dr. Sherryl Manges    History of Present Illness: Carolyn Mccall is a 36 y.o. female who returns for evaluation of syncope.  She has a history of syncope presumed to be from dysautonomia.  Prior event recorder demonstrated sinus rhythm correlating with tachycardia palpitations.  Last seen by Dr. Graciela Husbands 03/2011.  Current therapy has been increased salt in her diet.  He felt that in the event that she has worsening symptoms Florinef could be had her medications.  Patient experienced a syncopal episode one month ago in the setting of increased stress.  She went to the emergency room.  CBC and basic metabolic panel were both normal.  She followed up with her PCP who referred her back here.  Her job is very stressful.  She is also having difficulty with her ex-husband.  She developed heaviness in the back of her neck with increased palpitations and lightheadedness prior to her syncopal spell.  This was very similar to her prior episodes of syncope.  She has palpitations throughout most of the week.  Occasional left-sided chest pain.  She denies significant dyspnea.  She does note that her heart rate increases with going up steps.  No orthopnea, PND, edema.     Potassium  Date/Time Value Range Status  08/15/2011  4:52 PM 3.7  3.5-5.1 (mEq/L) Final     Creatinine, Ser  Date/Time Value Range Status  08/15/2011  4:52 PM 0.70  0.50-1.10 (mg/dL) Final     ALT  Date/Time Value Range Status  08/25/2009  3:10 PM 16  0-35 (U/L) Final     Hemoglobin  Date/Time Value Range Status  08/15/2011  4:52 PM 12.9  12.0-15.0 (g/dL) Final    Past Medical History  Diagnosis Date  . Syncope   . Migraine   . Fibrocystic breast   . Vitamin d  deficiency   . Acid reflux     Current Outpatient Prescriptions  Medication Sig Dispense Refill  . acetaminophen (TYLENOL) 500 MG tablet Take 1,000 mg by mouth every 6 (six) hours as needed. For pain      . cyclobenzaprine (FLEXERIL) 5 MG tablet Take 5 mg by mouth at bedtime.      Marland Kitchen dexlansoprazole (DEXILANT) 60 MG capsule Take 60 mg by mouth daily.        Marland Kitchen levonorgestrel (MIRENA) 20 MCG/24HR IUD 1 each by Intrauterine route once. As directed       . polyethylene glycol (MIRALAX / GLYCOLAX) packet Take 17 g by mouth as needed.        Allergies: No Known Allergies  History  Substance Use Topics  . Smoking status: Never Smoker   . Smokeless tobacco: Not on file  . Alcohol Use: Yes     soCIAL     ROS:  Please see the history of present illness.    All other systems reviewed and negative.   PHYSICAL EXAM: VS:  BP 145/92  Pulse 64  Ht 5' (1.524 m)  Wt 116 lb 12.8 oz (52.98 kg)  BMI 22.81 kg/m2  Filed Vitals:   09/10/11 1144 09/10/11 1145 09/10/11 1146 09/10/11 1147  BP: 146/94 160/93 145/95 145/92  Pulse: 69 67 67 64  Height:  Weight:         Well nourished, well developed, in no acute distress HEENT: normal Neck: no JVD Cardiac:  normal S1, S2; RRR; no murmur Lungs:  clear to auscultation bilaterally, no wheezing, rhonchi or rales Abd: soft, nontender, no hepatomegaly Ext: no edema Skin: warm and dry Neuro:  CNs 2-12 intact, no focal abnormalities noted  EKG:  Sinus rhythm, rate 66, normal axis, no change from prior tracing   ASSESSMENT AND PLAN:  1.  Syncope   -  Neurocardiogenic Syncope.  She has had prior event monitor.  Suspect anxiety playing a large role.  BP now running on high side.     -  I have asked her to decrease salt slightly as BP now running high.  No orthostatic BP drop today.  Would not start Florinef with how her BP is now.   -  Continue to push fluids.    -  Arrange Echo and ETT-echo.   -  Follow up with Dr. Sherryl Manges   2.   Palpitations   -  Resting HR 50-60s.  She has had event monitor previously.   -  Not sure she could tolerate daily beta blocker.    -  Obtain echo and ETT-echo for now.   -  Follow up with Dr. Graciela Husbands  3.  Chest Pain   -  Atypical.  Will check ETT-echo.  4.  Elevated BP   -  Decrease salt somewhat.   -  Eliminate caffeine.  5.  Anxiety   -  Advised her to seek counseling to help with stress.   Signed, Tereso Newcomer, PA-C  10:55 AM 09/10/2011

## 2011-09-20 ENCOUNTER — Ambulatory Visit (HOSPITAL_COMMUNITY): Payer: BC Managed Care – PPO | Attending: Internal Medicine

## 2011-09-20 ENCOUNTER — Other Ambulatory Visit: Payer: Self-pay

## 2011-09-20 DIAGNOSIS — Z978 Presence of other specified devices: Secondary | ICD-10-CM | POA: Insufficient documentation

## 2011-09-20 DIAGNOSIS — R030 Elevated blood-pressure reading, without diagnosis of hypertension: Secondary | ICD-10-CM | POA: Insufficient documentation

## 2011-09-20 DIAGNOSIS — G909 Disorder of the autonomic nervous system, unspecified: Secondary | ICD-10-CM | POA: Insufficient documentation

## 2011-09-20 DIAGNOSIS — R55 Syncope and collapse: Secondary | ICD-10-CM

## 2011-09-20 DIAGNOSIS — IMO0001 Reserved for inherently not codable concepts without codable children: Secondary | ICD-10-CM

## 2011-09-20 DIAGNOSIS — R002 Palpitations: Secondary | ICD-10-CM

## 2011-09-28 ENCOUNTER — Encounter: Payer: Self-pay | Admitting: Internal Medicine

## 2011-09-28 ENCOUNTER — Ambulatory Visit (HOSPITAL_COMMUNITY): Payer: BC Managed Care – PPO | Attending: Cardiology

## 2011-09-28 DIAGNOSIS — IMO0001 Reserved for inherently not codable concepts without codable children: Secondary | ICD-10-CM

## 2011-09-28 DIAGNOSIS — R55 Syncope and collapse: Secondary | ICD-10-CM

## 2011-09-28 DIAGNOSIS — R002 Palpitations: Secondary | ICD-10-CM

## 2011-10-11 ENCOUNTER — Ambulatory Visit: Payer: BC Managed Care – PPO | Admitting: Internal Medicine

## 2011-10-12 ENCOUNTER — Encounter: Payer: Self-pay | Admitting: *Deleted

## 2011-10-26 ENCOUNTER — Other Ambulatory Visit: Payer: Self-pay | Admitting: Family Medicine

## 2011-10-26 DIAGNOSIS — N644 Mastodynia: Secondary | ICD-10-CM

## 2011-10-27 ENCOUNTER — Encounter: Payer: Self-pay | Admitting: Internal Medicine

## 2011-11-09 ENCOUNTER — Ambulatory Visit
Admission: RE | Admit: 2011-11-09 | Discharge: 2011-11-09 | Disposition: A | Discharge status: Home / Self Care | Payer: BC Managed Care – PPO | Source: Ambulatory Visit | Attending: Family Medicine | Admitting: Family Medicine

## 2011-11-09 DIAGNOSIS — N644 Mastodynia: Secondary | ICD-10-CM

## 2011-12-10 ENCOUNTER — Other Ambulatory Visit: Payer: Self-pay | Admitting: Family Medicine

## 2011-12-10 ENCOUNTER — Other Ambulatory Visit (HOSPITAL_COMMUNITY)
Admission: RE | Admit: 2011-12-10 | Discharge: 2011-12-10 | Disposition: A | Discharge status: Home / Self Care | Payer: BC Managed Care – PPO | Source: Ambulatory Visit | Attending: Family Medicine | Admitting: Family Medicine

## 2011-12-10 DIAGNOSIS — Z01419 Encounter for gynecological examination (general) (routine) without abnormal findings: Secondary | ICD-10-CM | POA: Insufficient documentation

## 2012-02-24 ENCOUNTER — Other Ambulatory Visit: Payer: Self-pay | Admitting: Family Medicine

## 2012-02-24 DIAGNOSIS — R42 Dizziness and giddiness: Secondary | ICD-10-CM

## 2012-02-24 DIAGNOSIS — R51 Headache: Secondary | ICD-10-CM

## 2012-02-28 ENCOUNTER — Ambulatory Visit
Admission: RE | Admit: 2012-02-28 | Discharge: 2012-02-28 | Disposition: A | Discharge status: Home / Self Care | Payer: BC Managed Care – PPO | Source: Ambulatory Visit | Attending: Family Medicine | Admitting: Family Medicine

## 2012-02-28 DIAGNOSIS — R51 Headache: Secondary | ICD-10-CM

## 2012-02-28 DIAGNOSIS — R42 Dizziness and giddiness: Secondary | ICD-10-CM

## 2012-03-14 ENCOUNTER — Telehealth: Payer: Self-pay | Admitting: Internal Medicine

## 2012-03-14 NOTE — Telephone Encounter (Signed)
Pt rtn call to cory, pls call at [phone]

## 2012-03-14 NOTE — Telephone Encounter (Signed)
New problem:    C/o irregular heart rate on yesterday while a work.

## 2012-03-14 NOTE — Telephone Encounter (Signed)
LMTCB

## 2012-03-14 NOTE — Telephone Encounter (Signed)
Appt made to see Dr. Graciela Husbands in December.  C/o increased episodes of palpitations.

## 2012-04-07 ENCOUNTER — Ambulatory Visit (INDEPENDENT_AMBULATORY_CARE_PROVIDER_SITE_OTHER): Payer: BC Managed Care – PPO | Admitting: Internal Medicine

## 2012-04-07 ENCOUNTER — Encounter: Payer: Self-pay | Admitting: Internal Medicine

## 2012-04-07 VITALS — BP 142/92 | HR 72 | Resp 18 | Ht 60.0 in | Wt 116.0 lb

## 2012-04-07 DIAGNOSIS — R55 Syncope and collapse: Secondary | ICD-10-CM

## 2012-04-07 DIAGNOSIS — R030 Elevated blood-pressure reading, without diagnosis of hypertension: Secondary | ICD-10-CM

## 2012-04-07 DIAGNOSIS — IMO0001 Reserved for inherently not codable concepts without codable children: Secondary | ICD-10-CM

## 2012-04-07 DIAGNOSIS — R002 Palpitations: Secondary | ICD-10-CM

## 2012-04-07 NOTE — Assessment & Plan Note (Signed)
No recurrent syncope 

## 2012-04-07 NOTE — Assessment & Plan Note (Signed)
The abrupt nature of f these palpitations suggested they may represent a SVT secondary to reentry. Alternatively it could represent simply  sympathetic outflow assoc with neuroally mediated episode

## 2012-04-07 NOTE — Progress Notes (Signed)
  HPI  Carolyn Mccall is a 36 y.o. female Is seen in followup for syncope Presumed secondary to dysautonomia   She was given an event recorder showed only sinus rhythm correlating with tachypalpitations  She is mostly feeling much better with much less orthostatic intolerance. She is less lightheadedness. She has increased her sodium intake; she decided not to take the thermo tabs salt supplements.  She comes in because of an episode a month ago when she had the abrupt onset of tachypalpitations followed by residual fatigue and associated with diaphoresis and lightheadedness. It was frog negative. She has never had similar tachypalpitations.  She also has a series of other complaints relating to headaches weakness. She has multitude of medications which she was brings in with has questions about. She told me that she is going to get married in her warfarin wants her to have another child. She has a 27, 28 and 4-year-old currently      Past Medical History  Diagnosis Date  . Syncope   . Migraine   . Fibrocystic breast   . Vitamin D deficiency   . Acid reflux     No past surgical history on file.  Current Outpatient Prescriptions  Medication Sig Dispense Refill  . acetaminophen (TYLENOL) 500 MG tablet Take 1,000 mg by mouth every 6 (six) hours as needed. For pain      . cyclobenzaprine (FLEXERIL) 5 MG tablet Take 5 mg by mouth at bedtime.      Marland Kitchen dexlansoprazole (DEXILANT) 60 MG capsule Take 60 mg by mouth daily.        Marland Kitchen levonorgestrel (MIRENA) 20 MCG/24HR IUD 1 each by Intrauterine route once. As directed       . Linaclotide (LINZESS) 145 MCG CAPS Take 145 mcg by mouth daily.        No Known Allergies  Review of Systems negative except from HPI and PMH  Physical Exam BP 142/92  Pulse 72  Resp 18  Ht 5' (1.524 m)  Wt 116 lb (52.617 kg)  BMI 22.65 kg/m2  SpO2 92%  Well developed and well nourished in no acute distress HENT normal E scleral and icterus clear Neck  Supple JVP flat; carotids brisk and full Clear to ausculation Regular rate and rhythm, no murmurs gallops or rub Soft with active bowel sounds No clubbing cyanosis and edema Alert and oriented, grossly normal motor and sensory function Skin Warm and Dry  ECG  demonstrates sinus at 61 intervals 04/09/37 Otherwise normal  Assessment and  Plan

## 2012-04-07 NOTE — Patient Instructions (Signed)
Your physician wants you to follow-up in: 6 months with dr Logan Bores will receive a reminder letter in the mail two months in advance. If you don't receive a letter, please call our office to schedule the follow-up appointment.

## 2012-04-07 NOTE — Assessment & Plan Note (Signed)
She comes in today with elevated blood pressure and tells me a pressure recordings in the last week which has been elevated also. She has a family history of hypertension. I recommended that she followup with Dr. Cliffton Asters as she may well need hypertensive therapy.

## 2012-06-30 ENCOUNTER — Telehealth: Payer: Self-pay | Admitting: Internal Medicine

## 2012-06-30 ENCOUNTER — Encounter: Payer: Self-pay | Admitting: Internal Medicine

## 2012-06-30 ENCOUNTER — Ambulatory Visit (INDEPENDENT_AMBULATORY_CARE_PROVIDER_SITE_OTHER): Payer: BC Managed Care – PPO

## 2012-06-30 ENCOUNTER — Ambulatory Visit (INDEPENDENT_AMBULATORY_CARE_PROVIDER_SITE_OTHER): Payer: BC Managed Care – PPO | Admitting: Internal Medicine

## 2012-06-30 VITALS — BP 130/85 | HR 58 | Ht 60.0 in | Wt 113.0 lb

## 2012-06-30 DIAGNOSIS — F411 Generalized anxiety disorder: Secondary | ICD-10-CM

## 2012-06-30 DIAGNOSIS — F419 Anxiety disorder, unspecified: Secondary | ICD-10-CM

## 2012-06-30 DIAGNOSIS — R002 Palpitations: Secondary | ICD-10-CM

## 2012-06-30 DIAGNOSIS — R079 Chest pain, unspecified: Secondary | ICD-10-CM

## 2012-06-30 NOTE — Assessment & Plan Note (Signed)
She continues to have palpitations. She has had both isolated palpitations as well as tachypalpitations. In the past in the recording has not identified any arrhythmia associated with her symptoms. She is very anxious about these. We discussed that they are not likely to be fatal  , associated with a heart attack or stroke.we decided to pursue a repeat event recorder to try to clarify the mechanism and to see if we can identify a treatment target

## 2012-06-30 NOTE — Telephone Encounter (Signed)
Pt has been having tightness in her chest she getting cold chills and heart rate elevated and palpitations and left arm aching and she wants to know what she needs to do.

## 2012-06-30 NOTE — Assessment & Plan Note (Signed)
As above.

## 2012-06-30 NOTE — Telephone Encounter (Signed)
Spoke with patient and she is complaining of having palpitations with chest tightness yesterday. Today she is still complaining of palpitations and thinks that her heart goes fast and then slow. She is very weak and tired today but has no chest pain. Has not passed out.  She would like to be seen by Dr.Klein today. Advised for her to come to our clinic at 330 pm to see Dr.Klein for an evaluation.

## 2012-06-30 NOTE — Progress Notes (Signed)
  HPI  Carolyn Mccall is a 37 y.o. female Is seen in followup for syncope Presumed secondary to dysautonomia   She was given an event recorder showed only sinus rhythm correlating with tachypalpitations   She comes in today with complaints of palpitations feeling like her heart is skipping. These are associated with fatigue. She is also noticed mild fasciculations underneath her left eye.they have been present for the last week or so. She describes a mid chest and left arm discomfort is unrelated to exertion. Stress echo was normal 5/13  She seems to be extremely anxious. She is very stressed by work.   She has a 24, 69 and 79-year-old currently      Past Medical History  Diagnosis Date  . Syncope   . Migraine   . Fibrocystic breast   . Vitamin D deficiency   . Acid reflux     No past surgical history on file.  Current Outpatient Prescriptions  Medication Sig Dispense Refill  . acetaminophen (TYLENOL) 500 MG tablet Take 1,000 mg by mouth every 6 (six) hours as needed. For pain      . cyclobenzaprine (FLEXERIL) 5 MG tablet Take 5 mg by mouth at bedtime.      Marland Kitchen dexlansoprazole (DEXILANT) 60 MG capsule Take 60 mg by mouth daily.        Marland Kitchen levonorgestrel (MIRENA) 20 MCG/24HR IUD 1 each by Intrauterine route once. As directed       . Vitamin D, Ergocalciferol, (DRISDOL) 50000 UNITS CAPS Take 50,000 Units by mouth 2 (two) times a week.       No current facility-administered medications for this visit.    No Known Allergies  Review of Systems negative except from HPI and PMH  Physical Exam BP 130/85  Pulse 58  Ht 5' (1.524 m)  Wt 113 lb (51.256 kg)  BMI 22.07 kg/m2  Well developed and well nourished in no acute distress HENT normal E scleral and icterus clear;  thisfasciculations underneath her left eye Neck Supple JVP flat; carotids brisk and full Clear to ausculation Regular rate and rhythm, no murmurs gallops or rub Soft with active bowel sounds No clubbing  cyanosis and edema Alert and oriented, grossly normal motor and sensory function Skin Warm and Dry  ECG  demonstrates sinus at 53  Intervals 04/09/38 Axis 94  Assessment and  Plan

## 2012-06-30 NOTE — Assessment & Plan Note (Signed)
I suspect that this is another manifestation of her anxiety. She had a negative stress echo within the last year.she will try to work on stress management

## 2012-06-30 NOTE — Patient Instructions (Addendum)
Your physician has recommended that you wear an event monitor. Event monitors are medical devices that record the heart's electrical activity. Doctors most often [place] these monitors to diagnose arrhythmias. Arrhythmias are problems with the speed or rhythm of the heartbeat. The monitor is a small, portable device. You can wear one while you do your normal daily activities. This is usually used to diagnose what is causing palpitations/syncope (passing out).  Your physician recommends that you schedule a follow-up appointment in: 4-6 weeks with Dr. Graciela Husbands.

## 2012-07-12 NOTE — Progress Notes (Signed)
Enrolled patient to receive a event monitor

## 2012-07-17 DIAGNOSIS — R002 Palpitations: Secondary | ICD-10-CM

## 2012-08-10 ENCOUNTER — Other Ambulatory Visit: Payer: Self-pay | Admitting: Family Medicine

## 2012-08-10 DIAGNOSIS — N63 Unspecified lump in unspecified breast: Secondary | ICD-10-CM

## 2012-08-10 DIAGNOSIS — N644 Mastodynia: Secondary | ICD-10-CM

## 2012-08-18 ENCOUNTER — Ambulatory Visit (INDEPENDENT_AMBULATORY_CARE_PROVIDER_SITE_OTHER): Payer: BC Managed Care – PPO | Admitting: Internal Medicine

## 2012-08-18 ENCOUNTER — Encounter: Payer: Self-pay | Admitting: Internal Medicine

## 2012-08-18 VITALS — BP 128/82 | HR 60 | Ht 60.0 in | Wt 118.0 lb

## 2012-08-18 DIAGNOSIS — R002 Palpitations: Secondary | ICD-10-CM

## 2012-08-18 DIAGNOSIS — R55 Syncope and collapse: Secondary | ICD-10-CM

## 2012-08-18 DIAGNOSIS — R079 Chest pain, unspecified: Secondary | ICD-10-CM

## 2012-08-18 NOTE — Assessment & Plan Note (Signed)
No recurrent syncope 

## 2012-08-18 NOTE — Progress Notes (Signed)
  HPI  Carolyn Mccall is a 37 y.o. 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   she continues to complain of a variety of chest pains.. Stress echo was normal 5/13  She seems to be extremely anxious. She is very stressed by work.   She has a 48, 73 and 46-year-old currently      Past Medical History  Diagnosis Date  . Syncope   . Migraine   . Fibrocystic breast   . Vitamin D deficiency   . Acid reflux     No past surgical history on file.  Current Outpatient Prescriptions  Medication Sig Dispense Refill  . acetaminophen (TYLENOL) 500 MG tablet Take 1,000 mg by mouth every 6 (six) hours as needed. For pain      . cyclobenzaprine (FLEXERIL) 5 MG tablet Take 5 mg by mouth daily as needed.       Marland Kitchen dexlansoprazole (DEXILANT) 60 MG capsule Take 60 mg by mouth daily as needed.       Marland Kitchen levonorgestrel (MIRENA) 20 MCG/24HR IUD 1 each by Intrauterine route once. As directed       . Vitamin D, Ergocalciferol, (DRISDOL) 50000 UNITS CAPS Take 50,000 Units by mouth 2 (two) times a week.       No current facility-administered medications for this visit.    No Known Allergies  Review of Systems negative except from HPI and PMH  Physical Exam BP 128/82  Pulse 60  Ht 5' (1.524 m)  Wt 118 lb (53.524 kg)  BMI 23.05 kg/m2  Well developed and nourished in no acute distress HENT normal Neck supple with JVP-flat Clear Regular rate and rhythm, no murmurs or gallops Abd-soft with active BS No Clubbing cyanosis edema Skin-warm and dry A & Oriented  Grossly normal sensory and motor function   Assessment and  Plan

## 2012-08-18 NOTE — Patient Instructions (Signed)
Your physician wants you to follow-up in:  6 months. You will receive a reminder letter in the mail two months in advance. If you don't receive a letter, please call our office to schedule the follow-up appointment.   

## 2012-08-18 NOTE — Assessment & Plan Note (Signed)
Has continued to try to reassure her that her chest pain is noncardiac

## 2012-08-18 NOTE — Assessment & Plan Note (Signed)
Continues with palpitations. I suspect that these are PVCs or nothing. Unfortunately she did not use the event monitor.

## 2012-08-21 ENCOUNTER — Encounter: Payer: Self-pay | Admitting: *Deleted

## 2012-08-23 ENCOUNTER — Ambulatory Visit
Admission: RE | Admit: 2012-08-23 | Discharge: 2012-08-23 | Disposition: A | Discharge status: Home / Self Care | Payer: BC Managed Care – PPO | Source: Ambulatory Visit | Attending: Family Medicine | Admitting: Family Medicine

## 2012-08-23 DIAGNOSIS — N63 Unspecified lump in unspecified breast: Secondary | ICD-10-CM

## 2012-08-23 DIAGNOSIS — N644 Mastodynia: Secondary | ICD-10-CM

## 2012-12-25 ENCOUNTER — Other Ambulatory Visit (HOSPITAL_COMMUNITY)
Admission: RE | Admit: 2012-12-25 | Discharge: 2012-12-25 | Disposition: A | Discharge status: Home / Self Care | Payer: BC Managed Care – PPO | Source: Ambulatory Visit | Attending: Family Medicine | Admitting: Family Medicine

## 2012-12-25 ENCOUNTER — Other Ambulatory Visit: Payer: Self-pay | Admitting: Family Medicine

## 2012-12-25 DIAGNOSIS — Z124 Encounter for screening for malignant neoplasm of cervix: Secondary | ICD-10-CM | POA: Insufficient documentation

## 2012-12-28 ENCOUNTER — Other Ambulatory Visit: Payer: BC Managed Care – PPO | Admitting: Gastroenterology

## 2012-12-28 DIAGNOSIS — R1011 Right upper quadrant pain: Secondary | ICD-10-CM

## 2012-12-28 DIAGNOSIS — R110 Nausea: Secondary | ICD-10-CM

## 2013-01-18 ENCOUNTER — Encounter (HOSPITAL_COMMUNITY)
Admission: RE | Admit: 2013-01-18 | Discharge: 2013-01-18 | Disposition: A | Discharge status: Home / Self Care | Payer: BC Managed Care – PPO | Source: Ambulatory Visit | Attending: Gastroenterology | Admitting: Gastroenterology

## 2013-01-18 ENCOUNTER — Ambulatory Visit (HOSPITAL_COMMUNITY)
Admission: RE | Admit: 2013-01-18 | Discharge: 2013-01-18 | Disposition: A | Discharge status: Home / Self Care | Payer: BC Managed Care – PPO | Source: Ambulatory Visit | Attending: Gastroenterology | Admitting: Gastroenterology

## 2013-01-18 ENCOUNTER — Encounter (HOSPITAL_COMMUNITY): Payer: Self-pay

## 2013-01-18 DIAGNOSIS — R110 Nausea: Secondary | ICD-10-CM | POA: Insufficient documentation

## 2013-01-18 DIAGNOSIS — R1011 Right upper quadrant pain: Secondary | ICD-10-CM | POA: Insufficient documentation

## 2013-01-18 DIAGNOSIS — R112 Nausea with vomiting, unspecified: Secondary | ICD-10-CM | POA: Insufficient documentation

## 2013-01-18 MED ORDER — TECHNETIUM TC 99M MEBROFENIN IV KIT
5.0000 | PACK | Freq: Once | INTRAVENOUS | Status: AC | PRN
  Administered 2013-01-18: 5 via INTRAVENOUS

## 2013-01-18 MED ORDER — SINCALIDE 5 MCG IJ SOLR
0.0200 ug/kg | Freq: Once | INTRAMUSCULAR | Status: AC
  Administered 2013-01-18: 1.1 ug via INTRAVENOUS

## 2013-02-14 ENCOUNTER — Ambulatory Visit (INDEPENDENT_AMBULATORY_CARE_PROVIDER_SITE_OTHER): Payer: BC Managed Care – PPO | Admitting: Internal Medicine

## 2013-02-14 ENCOUNTER — Encounter (INDEPENDENT_AMBULATORY_CARE_PROVIDER_SITE_OTHER): Payer: Self-pay

## 2013-02-14 ENCOUNTER — Encounter: Payer: Self-pay | Admitting: Internal Medicine

## 2013-02-14 VITALS — BP 122/84 | HR 58 | Ht 60.0 in | Wt 123.2 lb

## 2013-02-14 DIAGNOSIS — R030 Elevated blood-pressure reading, without diagnosis of hypertension: Secondary | ICD-10-CM

## 2013-02-14 DIAGNOSIS — IMO0001 Reserved for inherently not codable concepts without codable children: Secondary | ICD-10-CM

## 2013-02-14 DIAGNOSIS — R55 Syncope and collapse: Secondary | ICD-10-CM

## 2013-02-14 DIAGNOSIS — R002 Palpitations: Secondary | ICD-10-CM

## 2013-02-14 NOTE — Patient Instructions (Signed)
Your physician wants you to follow-up in: one year with Dr. Klein.  You will receive a reminder letter in the mail two months in advance. If you don't receive a letter, please call our office to schedule the follow-up appointment.  Your physician recommends that you continue on your current medications as directed. Please refer to the Current Medication list given to you today.  

## 2013-02-14 NOTE — Assessment & Plan Note (Signed)
No recurrent symptoms in increase salt and fluid intake

## 2013-02-14 NOTE — Assessment & Plan Note (Signed)
We discussed potential relationship to caffeine. She'll try and decrease her caffeine intake

## 2013-02-14 NOTE — Assessment & Plan Note (Signed)
Well controlled 

## 2013-02-14 NOTE — Progress Notes (Signed)
  HPI  Carolyn Mccall is a 37 y.o. 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 She has not had anymore problems with dizziness in her increase salt intake.   Has occasional chest pain; Stress echo was normal 5/13  She  continues with occasional palpitations which may be aggravated by coffee         Past Medical History  Diagnosis Date  . Syncope   . Migraine   . Fibrocystic breast   . Vitamin D deficiency   . Acid reflux     No past surgical history on file.  Current Outpatient Prescriptions  Medication Sig Dispense Refill  . acetaminophen (TYLENOL) 500 MG tablet Take 1,000 mg by mouth every 6 (six) hours as needed. For pain      . cyclobenzaprine (FLEXERIL) 5 MG tablet Take 5 mg by mouth daily as needed.       Marland Kitchen dexlansoprazole (DEXILANT) 60 MG capsule Take 60 mg by mouth daily as needed.       Marland Kitchen levonorgestrel (MIRENA) 20 MCG/24HR IUD 1 each by Intrauterine route once. As directed       . Vitamin D, Ergocalciferol, (DRISDOL) 50000 UNITS CAPS Take 50,000 Units by mouth 2 (two) times a week.       No current facility-administered medications for this visit.    No Known Allergies  Review of Systems negative except from HPI and PMH  Physical Exam LMP 12/27/2012 BP 122/84  Pulse 58  Ht 5' (1.524 m)  Wt 123 lb 3.2 oz (55.883 kg)  BMI 24.06 kg/m2  LMP 12/27/2012  Well developed and nourished in no acute distress HENT normal Neck supple with JVP-flat Clear Regular rate and rhythm, no murmurs or gallops Abd-soft with active BS No Clubbing cyanosis edema Skin-warm and dry A & Oriented  Grossly normal sensory and motor function   Assessment and  Plan  ECG demonstrates sinus rhythm at 61 intervals 04/10/36 Axis 60

## 2013-08-28 ENCOUNTER — Encounter: Payer: Self-pay | Admitting: Diagnostic Neuroimaging

## 2013-08-28 ENCOUNTER — Ambulatory Visit (INDEPENDENT_AMBULATORY_CARE_PROVIDER_SITE_OTHER): Payer: BC Managed Care – PPO | Admitting: Diagnostic Neuroimaging

## 2013-08-28 ENCOUNTER — Encounter (INDEPENDENT_AMBULATORY_CARE_PROVIDER_SITE_OTHER): Payer: Self-pay

## 2013-08-28 VITALS — BP 131/78 | HR 57 | Ht 59.5 in | Wt 125.0 lb

## 2013-08-28 DIAGNOSIS — M5481 Occipital neuralgia: Secondary | ICD-10-CM

## 2013-08-28 DIAGNOSIS — G43009 Migraine without aura, not intractable, without status migrainosus: Secondary | ICD-10-CM

## 2013-08-28 DIAGNOSIS — M531 Cervicobrachial syndrome: Secondary | ICD-10-CM

## 2013-08-28 MED ORDER — GABAPENTIN 100 MG PO CAPS: 100.0000 mg | ORAL_CAPSULE | Freq: Every day | ORAL | Status: DC

## 2013-08-28 NOTE — Patient Instructions (Signed)
Try gabapentin 100mg  at bedtime. May increase to 200 or 300mg  at bedtime as tolerated.

## 2013-08-28 NOTE — Progress Notes (Signed)
[facility]  PATIENT: Carolyn Mccall DOB: July 02, 1975  REFERRING CLINICIAN: C White HISTORY FROM: patient  REASON FOR VISIT: new consult   HISTORICAL  CHIEF COMPLAINT:  Chief Complaint  Patient presents with  . Neurologic Problem    NP #6    HISTORY OF PRESENT ILLNESS:   38 year old right-handed female with history of migraine, here for evaluation of headaches. At age 59 years old patient developed new onset of migraine headaches consisting of global headache, nausea, vomiting, photophobia and phonophobia. Headaches last weeks a time. She was having up to 2 weeks of migraine per month. Sometimes she would pass out with the pain. Patient was tried on Effexor initially but she had intolerance. She was tried on another migraine medication, but doesn't remember the name, and after 2 weeks her migraine significantly improve. Patient stopped that medication and since that time patient has had significant reduction in migraine headaches. Nowadays she only has one severe migraine per year.  Over past couple of years patient has had a new type of symptom/headache consisting of intermittent, brief sharp shooting "pinch" pain in the right or left parietal occipital region in the scalp. Symptoms occur without warning. No specific triggers. When the pain hits, she grabs her head and weeks for to go away. Sometimes she has closed eyes because the pain is so severe. No nausea vomiting, photophobia or phonophobia with these new symptoms. 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 Patient is having these twice per week.  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 Patient has some neck pain.   REVIEW OF SYSTEMS: Full 14 system review of systems performed and notable only for memory difficulty insomnia headache dizziness anxiety not sleeping easy  bruising eye pain blurred vision palpitation ringing in ears skin moles constipation.  ALLERGIES: No Known Allergies  HOME MEDICATIONS: Outpatient Prescriptions Prior to Visit  Medication Sig Dispense Refill  . acetaminophen (TYLENOL) 500 MG tablet Take 1,000 mg by mouth every 6 (six) hours as needed. For pain      . cyclobenzaprine (FLEXERIL) 5 MG tablet Take 5 mg by mouth daily as needed.       . Vitamin D, Ergocalciferol, (DRISDOL) 50000 UNITS CAPS Take 50,000 Units by mouth 2 (two) times a week.      Marland Kitchen dexlansoprazole (DEXILANT) 60 MG capsule Take 60 mg by mouth daily.        No facility-administered medications prior to visit.    PAST MEDICAL HISTORY: Past Medical History  Diagnosis Date  . Syncope   . Migraine   . Fibrocystic breast   . Vitamin D deficiency   . Acid reflux     PAST SURGICAL HISTORY: Past Surgical History  Procedure Laterality Date  . Breast surgery      FAMILY HISTORY: Family History  Problem Relation Age of Onset  . Stroke Father   . Cancer Maternal Aunt   . Cancer Maternal Uncle     SOCIAL HISTORY:  History   Social History  . Marital Status: Single    Spouse Name: N/A    Number of Children: 2  . Years of Education: college   Occupational History  .  [employer]   Social History Main Topics  . Smoking status: Never Smoker   . Smokeless tobacco: Not on file  . Alcohol Use: Yes     Comment: soCIAL  . Drug Use: No  .  Sexual Activity: Not on file   Other Topics Concern  . Not on file   Social History Narrative   Patient lives at home with her family.   Patient drinks coffe           PHYSICAL EXAM  Filed Vitals:   08/28/13 0943  BP: 131/78  Pulse: 57  Height: 4' 11.5" (1.511 m)  Weight: 125 lb (56.7 kg)    Not recorded    Body mass index is 24.83 kg/(m^2).  GENERAL EXAM: Patient is in no distress; well developed, nourished and groomed; neck is supple; NO OCCIPITAL NERVE TENDERNESS TO  PALPATION.  CARDIOVASCULAR: Regular rate and rhythm, no murmurs, no carotid bruits  NEUROLOGIC: MENTAL STATUS: awake, alert, oriented to person, place and time, recent and remote memory intact, normal attention and concentration, language fluent, comprehension intact, naming intact, fund of knowledge appropriate CRANIAL NERVE: no papilledema on fundoscopic exam, pupils equal and reactive to light, visual fields full to confrontation, extraocular muscles intact, no nystagmus, facial sensation and strength symmetric, hearing intact, palate elevates symmetrically, uvula midline, shoulder shrug symmetric, tongue midline. MOTOR: normal bulk and tone, full strength in the BUE, BLE SENSORY: normal and symmetric to light touch, pinprick, temperature, vibration and proprioception COORDINATION: finger-nose-finger, fine finger movements, heel-shin normal REFLEXES: deep tendon reflexes present and symmetric GAIT/STATION: narrow based gait; romberg is negative    DIAGNOSTIC DATA (LABS, IMAGING, TESTING) - I reviewed patient records, labs, notes, testing and imaging myself where available.  Lab Results  Component Value Date   WBC 6.7 08/15/2011   HGB 12.9 08/15/2011   HCT 38.0 08/15/2011   MCV 94.6 08/15/2011   PLT 191 08/15/2011      Component Value Date/Time   NA 141 08/15/2011 1652   K 3.7 08/15/2011 1652   CL 107 08/15/2011 1652   CO2 25 08/15/2011 1638   GLUCOSE 93 08/15/2011 1652   BUN 6 08/15/2011 1652   CREATININE 0.70 08/15/2011 1652   CALCIUM 8.3* 08/15/2011 1638   PROT 7.5 08/25/2009 1510   ALBUMIN 4.1 08/25/2009 1510   AST 20 08/25/2009 1510   ALT 16 08/25/2009 1510   ALKPHOS 45 08/25/2009 1510   BILITOT 0.7 08/25/2009 1510   GFRNONAA >90 08/15/2011 1638   GFRAA >90 08/15/2011 1638   No results found for this basename: CHOL, HDL, LDLCALC, LDLDIRECT, TRIG, CHOLHDL   No results found for this basename: HGBA1C   No results found for this basename: VITAMINB12   No results found for this  basename: TSH    02/29/12 MRI BRAIN 1. Partially empty sella configuration. This can be a normal  anatomic variant, but also can be a sign of idiopathic intracranial  hypertension (pseudotumor cerebral). CSF opening pressure would  evaluate further.  2. Mildly advanced for age nonspecific small signal change in the  cerebral white matter. One area in the left parietal lobe sensory  strip which could represent chronic micro hemorrhage, or less  likely a small cavernous vascular malformation. Differential  considerations include accelerated/hereditary small vessel  ischemia, sequelae of trauma, hypercoagulable state, vasculitis,  migraines, prior infection or less likely demyelination.  3. 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 - I reviewed images myself and agree with interpretation. -VRP     ASSESSMENT AND PLAN  38 y.o. 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 Patient did not benefit from trigger point injection  2 years ago.  PLAN: - trial of gabapentin - may consider repeat trigger point injection in future  Meds ordered this encounter  Medications  . gabapentin (NEURONTIN) 100 MG capsule    Sig: Take 1-3 capsules (100-300 mg total) by mouth at bedtime.    Dispense:  90 capsule    Refill:  6   Return in about 3 months (around 11/27/2013) for with Heide GuileLynn Lam or Tyniesha Howald.    Suanne MarkerVIKRAM R. Madlynn Lundeen, MD 08/28/2013, 10:21 AM Certified in Neurology, Neurophysiology and Neuroimaging  [facility] [address], [address] [place], [place] [address] [phone]

## 2013-11-27 ENCOUNTER — Ambulatory Visit (INDEPENDENT_AMBULATORY_CARE_PROVIDER_SITE_OTHER): Payer: BC Managed Care – PPO | Admitting: Nurse Practitioner

## 2013-11-27 ENCOUNTER — Encounter: Payer: Self-pay | Admitting: Nurse Practitioner

## 2013-11-27 ENCOUNTER — Ambulatory Visit: Payer: BC Managed Care – PPO | Admitting: Nurse Practitioner

## 2013-11-27 VITALS — BP 129/78 | HR 66 | Ht 59.5 in | Wt 126.0 lb

## 2013-11-27 DIAGNOSIS — R9089 Other abnormal findings on diagnostic imaging of central nervous system: Secondary | ICD-10-CM

## 2013-11-27 DIAGNOSIS — M5481 Occipital neuralgia: Secondary | ICD-10-CM

## 2013-11-27 DIAGNOSIS — M531 Cervicobrachial syndrome: Secondary | ICD-10-CM

## 2013-11-27 DIAGNOSIS — R51 Headache: Secondary | ICD-10-CM

## 2013-11-27 DIAGNOSIS — M542 Cervicalgia: Secondary | ICD-10-CM

## 2013-11-27 DIAGNOSIS — R519 Headache, unspecified: Secondary | ICD-10-CM

## 2013-11-27 DIAGNOSIS — R930 Abnormal findings on diagnostic imaging of skull and head, not elsewhere classified: Secondary | ICD-10-CM

## 2013-11-27 MED ORDER — NORTRIPTYLINE HCL 10 MG PO CAPS: 10.0000 mg | ORAL_CAPSULE | Freq: Every day | ORAL | Status: DC

## 2013-11-27 MED ORDER — NAPROXEN 500 MG PO TABS: 500.0000 mg | ORAL_TABLET | Freq: Two times a day (BID) | ORAL | Status: DC | PRN

## 2013-11-27 NOTE — Patient Instructions (Addendum)
-   Stop gabapentin.  - Start Nortriptyline, 10 mg, 1 tablet each night as headache prevention.   - Check MRI w/o contrast Brain and Cervical spine. - Start Naproxen 500 mg 1 tablet every 12 hours as needed for headache. - may consider repeat trigger point injection in future.  You may try yoga as a relaxation activity which may help your headaches.

## 2013-11-27 NOTE — Progress Notes (Signed)
PATIENT: Carolyn KirschnerChristy Siu Mccall DOB: Aug 27, 1975  REASON FOR VISIT: routine follow up for chronic daily headache, occipital neuralgia, hx of Migraine HISTORY FROM: patient  HISTORY OF PRESENT ILLNESS: 38 year old right-handed female with history of migraine, here for evaluation of headaches.   Update 11/27/13 (LL): Since last visit patient's headaches are unchanged, gabapentin has helped her sleep but has not change the frequency or intensity of the headaches. She is currently having daily headaches, treating them with Tylenol. Sharp occipital pains are intermittent and may last from 5-20 minutes at a time. She is contemplating having a child with her long-term fianc and is currently off birth control.  She has a constant feeling of pressure in her neck which is relieved by neck popping. She has neck pain and stiffness, and pain shooting from her neck to in-between her shoulder blades.  08/28/13 (VP): At age 38 years old patient developed new onset of migraine headaches consisting of global headache, nausea, vomiting, photophobia and phonophobia. Headaches last weeks a time. She was having up to 2 weeks of migraine per month. Sometimes she would pass out with the pain. Patient was tried on Effexor initially but she had intolerance. She was tried on another migraine medication, but doesn't remember the name, and after 2 weeks her migraine significantly improve. Patient stopped that medication and since that time patient has had significant reduction in migraine headaches. Nowadays she only has one severe migraine per year.   Over past couple of years patient has had a new type of symptom/headache consisting of intermittent, brief sharp shooting "pinch" pain in the right or left parietal occipital region in the scalp. Symptoms occur without warning. No specific triggers. When the pain hits, she grabs her head and waits for it to go away. Sometimes she has closed eyes because the pain is so severe. No nausea  vomiting, photophobia or phonophobia with these new symptoms. 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 Patient is having these twice per week.  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 Patient has some neck pain.   REVIEW OF SYSTEMS: Full 14 system review of systems performed and notable only for headache dizziness, light sensitivity, activity change, appetite change, unexpected weight gain, palpitation, abdominal pain, constipation, nausea, back pain, aching muscles, neck pain, neck stiffness.  ALLERGIES: No Known Allergies  HOME MEDICATIONS: Outpatient Prescriptions Prior to Visit  Medication Sig Dispense Refill  . acetaminophen (TYLENOL) 500 MG tablet Take 1,000 mg by mouth every 6 (six) hours as needed. For pain      . meclizine (ANTIVERT) 25 MG tablet Take 25 mg by mouth 3 (three) times daily as needed for dizziness.      . gabapentin (NEURONTIN) 100 MG capsule Take 1-3 capsules (100-300 mg total) by mouth at bedtime.  90 capsule  6  . cyclobenzaprine (FLEXERIL) 5 MG tablet Take 5 mg by mouth daily as needed.       . Vitamin D, Ergocalciferol, (DRISDOL) 50000 UNITS CAPS Take 5,000 Units by mouth daily.        No facility-administered medications prior to visit.    PHYSICAL EXAM Filed Vitals:   11/27/13 1500  BP: 129/78  Pulse: 66  Height: 4' 11.5" (1.511 m)  Weight: 126 lb (57.153 kg)   Body mass index is 25.03 kg/(m^2).  Generalized: Well developed, in no acute distress  Head: normocephalic and atraumatic. Oropharynx benign. NO  OCCIPITAL NERVE TENDERNESS TO PALPATION.  Neck: Supple, no carotid bruits  Cardiac: Regular rate rhythm, no murmur  Musculoskeletal: No deformity, decreased ROM of neck flexion, extension, and rotation   Neurological examination  Mentation: Alert oriented to time, place, history taking. Follows all commands  speech and language fluent Cranial nerve II-XII: Fundoscopic exam not done. Pupils were equal round reactive to light extraocular movements were full, visual field were full on confrontational test. Facial sensation and strength were normal. hearing was intact to finger rubbing bilaterally. Uvula tongue midline. head turning and shoulder shrug and were normal and symmetric.Tongue protrusion into cheek strength was normal. Motor: The motor testing reveals 5 over 5 strength of all 4 extremities. Good symmetric motor tone is noted throughout.  Sensory: Sensory testing is intact to soft touch on all 4 extremities. No evidence of extinction is noted.  Coordination: Cerebellar testing reveals good finger-nose-finger and heel-to-shin bilaterally.  Gait and station: Gait is normal. Tandem gait is normal. Romberg is negative. Reflexes: Deep tendon reflexes are symmetric and normal bilaterally.   DIAGNOSTIC DATA (LABS, IMAGING, TESTING) - I reviewed patient records, labs, notes, testing and imaging myself where available.  02/29/12 MRI BRAIN  Partially empty sella configuration. This can be a normal anatomic variant, but also can be a sign of idiopathic intracranial hypertension (pseudotumor cerebral). CSF opening pressure would evaluate further.  Mildly advanced for age nonspecific small signal change in the cerebral white matter. One area in the left parietal lobe sensory strip which could represent chronic micro hemorrhage, or less likely a small cavernous vascular malformation. Differential considerations include accelerated/hereditary small vessel ischemia, sequelae of trauma, hypercoagulable state, vasculitis, migraines, prior infection or less likely demyelination. 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   ASSESSMENT: 38 y.o. 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 Patient did not  benefit from trigger point injection 2 years ago. Headaches have become chronic and daily, possibly aggravated by cervical neck pain.   PLAN: - Continue gabapentin 100-300 mg at bedtime, may also take 100 mg capsule every 8 hours during the day if it is helpful with headache pain.  If she finds that she's pregnant she's instructed to stop the medication. - Considered switching gabapentin to nortriptyline, but she's contemplating pregnancy and nortriptyline is pregnancy class D. - Start Naproxen 500 mg 1 tablet every 12 hours as needed for headache. - Check MRI Brain w and wo contrast and Cervical spine wo. - may consider repeat trigger point injection in future   Orders Placed This Encounter  Procedures  . MR Cervical Spine W Contrast  . MR Brain W Wo Contrast   Meds ordered this encounter  Medications  . naproxen (NAPROSYN) 500 MG tablet    Sig: Take 1 tablet (500 mg total) by mouth every 12 (twelve) hours as needed.    Dispense:  60 tablet    Refill:  2    Order Specific Question:  Supervising Provider    Answer:  Suanne Marker [[id number]]   Return in about 3 months (around 02/27/2014) for Headaches, occipital neuralgia.  Tawny Asal LAM, MSN, FNP-BC, A/GNP-C 11/27/2013, 4:33 PM [facility] [address] [place], [place] [phone]  Note: This document was prepared with digital dictation and possible smart phrase technology. Any transcriptional errors that result from this process are unintentional.

## 2013-12-03 NOTE — Progress Notes (Signed)
I reviewed note and agree with plan.   Leslieann Whisman R. Jazminn Pomales, MD 12/03/2013, 1:12 PM Certified in Neurology, Neurophysiology and Neuroimaging  [facility] [address], [address] [place],  [address] [phone]  

## 2013-12-10 ENCOUNTER — Ambulatory Visit: Payer: BC Managed Care – PPO | Admitting: Nurse Practitioner

## 2014-03-04 ENCOUNTER — Ambulatory Visit (INDEPENDENT_AMBULATORY_CARE_PROVIDER_SITE_OTHER): Payer: No Typology Code available for payment source | Admitting: Nurse Practitioner

## 2014-03-04 ENCOUNTER — Encounter: Payer: Self-pay | Admitting: Nurse Practitioner

## 2014-03-04 VITALS — BP 131/75 | Ht 59.0 in | Wt 129.6 lb

## 2014-03-04 DIAGNOSIS — R9089 Other abnormal findings on diagnostic imaging of central nervous system: Secondary | ICD-10-CM

## 2014-03-04 DIAGNOSIS — R519 Headache, unspecified: Secondary | ICD-10-CM

## 2014-03-04 DIAGNOSIS — R930 Abnormal findings on diagnostic imaging of skull and head, not elsewhere classified: Secondary | ICD-10-CM

## 2014-03-04 DIAGNOSIS — R42 Dizziness and giddiness: Secondary | ICD-10-CM

## 2014-03-04 DIAGNOSIS — R51 Headache: Secondary | ICD-10-CM

## 2014-03-04 MED ORDER — PROPRANOLOL HCL ER 60 MG PO CP24: 60.0000 mg | ORAL_CAPSULE | Freq: Every day | ORAL | Status: DC

## 2014-03-04 NOTE — Progress Notes (Signed)
PATIENT: Carolyn KirschnerChristy Siu Mccall DOB: 1976-04-01  REASON FOR VISIT: routine follow up for headaches HISTORY FROM: patient  HISTORY OF PRESENT ILLNESS: 38 year old right-handed female with history of migraine, here for evaluation of headaches.   Update 03/04/14 (LL): Since last visit, headaches are unchanged.  Could not afford to schedule MRI due to high ins. deductible.  Occasionally having sharp occipital pains. She has since married and not preventing pregnancy. Complains of occasional lightheadedness and palpitations.  Other times has vertigo sensation and takes meclizine. Denies nausea or vomiting, balance difficulties, phonophobia or photophobia. No changes in vision. Using Flexeril prn for neck pain and stiffness. 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  Update 11/27/13 (LL): Since last visit patient's headaches are unchanged, gabapentin has helped her sleep but has not change the frequency or intensity of the headaches. She is currently having daily headaches, treating them with Tylenol. Sharp occipital pains are intermittent and may last from 5-20 minutes at a time. She is contemplating having a child with her long-term fianc and is currently off birth control.  She has a constant feeling of pressure in her neck which is relieved by neck popping. She has neck pain and stiffness, and pain shooting from her neck to in-between her shoulder blades.  08/28/13 (VP): At age 38 years old patient developed new onset of migraine headaches consisting of global headache, nausea, vomiting, photophobia and phonophobia. Headaches last weeks a time. She was having up to 2 weeks of migraine per month. Sometimes she would pass out with the pain. Patient was tried on Effexor initially but she had intolerance. She was tried on another migraine medication, but doesn't remember the name, and after 2 weeks her migraine significantly improved. Patient stopped that  medication and since that time patient has had significant reduction in migraine headaches. Nowadays she only has one severe migraine per year.    Over past couple of years patient has had a new type of symptom/headache consisting of intermittent, brief sharp shooting "pinch" pain in the right or left parietal occipital region in the scalp. Symptoms occur without warning. No specific triggers. When the pain hits, she grabs her head and waits for it to go away. Sometimes she has closed eyes because the pain is so severe. No nausea vomiting, photophobia or phonophobia with these new symptoms. 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 Patient is having these twice per week.  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 Patient has some neck pain.    REVIEW OF SYSTEMS: Full 14 system review of systems performed and notable only for: headaches, dizziness   ALLERGIES: No Known Allergies  HOME MEDICATIONS: Outpatient Prescriptions Prior to Visit  Medication Sig Dispense Refill  . acetaminophen (TYLENOL) 500 MG tablet Take 1,000 mg by mouth every 6 (six) hours as needed. For pain    . cyclobenzaprine (FLEXERIL) 10 MG tablet Take 10 mg by mouth as needed.     . meclizine (ANTIVERT) 25 MG tablet Take 25 mg by mouth 3 (three) times daily as needed for dizziness.    . naproxen (NAPROSYN) 500 MG tablet Take 1 tablet (500 mg total) by mouth every 12 (twelve) hours as needed. 60 tablet 2  . VITAMIN D, ERGOCALCIFEROL, PO Take 5,000 Units by mouth daily.      No facility-administered medications prior to visit.  PHYSICAL EXAM Filed Vitals:   03/04/14 1544  BP: 131/75  Height: 4\' 11"  (1.499 m)  Weight: 129 lb 9.6 oz (58.786 kg)   Body mass index is 26.16 kg/(m^2).  Generalized: Well developed, in no acute distress   Head: normocephalic and atraumatic. Oropharynx  benign. NO OCCIPITAL NERVE TENDERNESS TO PALPATION.  Neck: Supple, no carotid bruits   Cardiac: Regular rate rhythm, no murmur   Musculoskeletal: No deformity, decreased ROM of neck flexion, extension, and rotation   Neurological examination   Mentation: Alert oriented to time, place, history taking. Follows all commands speech and language fluent Cranial nerve II-XII: Pupils were equal round reactive to light extraocular movements were full, visual field were full on confrontational test. Facial sensation and strength were normal. hearing was intact to finger rubbing bilaterally. Uvula tongue midline. head turning and shoulder shrug and were normal and symmetric. Tongue protrusion into cheek strength was normal. Motor: The motor testing reveals 5 over 5 strength of all 4 extremities. Good symmetric motor tone is noted throughout.   Sensory: Sensory testing is intact to soft touch on all 4 extremities. No evidence of extinction is noted.   Coordination: Cerebellar testing reveals good finger-nose-finger and heel-to-shin bilaterally.   Gait and station: Gait is normal. Tandem gait is normal. Romberg is negative. Reflexes: Deep tendon reflexes are symmetric and normal bilaterally.   DIAGNOSTIC DATA (LABS, IMAGING, TESTING) - I reviewed patient records, labs, notes, testing and imaging myself where available.  02/29/12 MRI BRAIN   Partially empty sella configuration. This can be a normal anatomic variant, but also can be a sign of idiopathic intracranial hypertension (pseudotumor cerebral). CSF opening pressure would evaluate further.  Mildly advanced for age nonspecific small signal change in the cerebral white matter. One area in the left parietal lobe sensory strip which could represent chronic micro hemorrhage, or less likely a small cavernous vascular malformation. Differential considerations include accelerated/hereditary small vessel ischemia, sequelae of trauma, hypercoagulable state,  vasculitis, migraines, prior infection or less likely demyelination. 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    ASSESSMENT: 38 y.o. 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 Patient did not benefit from trigger point injection 2 years ago. Headaches have become chronic and daily, possibly aggravated by cervical neck pain. Gabapentin was not effective as preventative. Cannot take Beta blockers due to low HR. Other options such as Topamax or Nortriptyline are not available due to her trying to conceive.  PLAN: - Continue Naproxen or Tylenol as needed for headache.  - Check MRI Brain w and wo contrast when new insurance starts. - Follow up with Dr. Marjory LiesPenumalli in 2 months.  Orders Placed This Encounter  Procedures  . MR Brain W Wo Contrast   Tawny AsalLYNN E. Leor Whyte, MSN, FNP-BC, A/GNP-C 03/04/2014, 4:44 PM [facility] [address], [address] [place], [place] [address] [phone]  Note: This document was prepared with digital dictation and possible smart phrase technology. Any transcriptional errors that result from this process are unintentional.

## 2014-03-04 NOTE — Patient Instructions (Signed)
Start Inderal LA (Propanolol) 60 mg, 1 capsule at bedtime daily.for headache prevention. [facility]   Common side effect reported are: lethargy, sedation, low blood pressure and low pulse rate. Please monitor your BP and Pulse every few days, if your pulse drops lower than 55 you may feel bad and we may have to adjust your dose. Same with your BP below 110/55.   Continue taking Tylenol as needed for headache or Naproxen.   We will schedule your MRI of the brain after your insurance becomes effective.  Follow up in 2 months with Dr. Marjory LiesPenumalli, sooner as needed.

## 2014-03-05 DIAGNOSIS — Z0289 Encounter for other administrative examinations: Secondary | ICD-10-CM

## 2014-03-18 ENCOUNTER — Telehealth: Payer: Self-pay | Admitting: Diagnostic Neuroimaging

## 2014-03-25 NOTE — Telephone Encounter (Signed)
Noted. -VRP 

## 2014-04-01 NOTE — Progress Notes (Signed)
I reviewed note and agree with plan.   Suanne MarkerVIKRAM R. PENUMALLI, MD 04/01/2014, 12:46 AM Certified in Neurology, Neurophysiology and Neuroimaging  [facility] [address], [address] [place], [place] [address] [phone]

## 2014-05-03 NOTE — L&D Delivery Note (Signed)
Delivery Note At 12:15 AM, on Dec. 29, 2016, a viable female "Carolyn Mccall" was delivered via Vaginal, Spontaneous Delivery (Presentation: Left Occiput Anterior with restitution to LOT).  Shoulders delivered easily and infant with good tone and spontaneous cry. Tactile stimulation given by provider and infant placed on mother's abdomen where nurse continued tactile stimulation.  Infant APGAR: 8, 9.  Cord clamped, cut, and blood collected. Placenta delivered spontaneously and noted to be intact with 3VC upon inspection.  Vaginal inspection revealed a 2nd degree perineal laceration that was repaired with 3-0 vicryl on a SH-needle.  1% lidocaine anesthetic necessary for local injection and patient tolerated the procedure well. Fundus firm, at the umbilicus, and bleeding small.  Mother hemodynamically stable and infant skin to skin prior to provider exit.  Mother desires IUD for birth control and opts to breast and bottle feed.  Infant weight at one hour of life: 7lb 2.1oz  Anesthesia: Local  Episiotomy: None Lacerations: 2nd degree Suture Repair: 3.0 vicryl Est. Blood Loss (mL): 200  Mom to postpartum.  Baby to Couplet care / Skin to Skin.  Cherre RobinsJessica L Brittiney Dicostanzo MSN, CNM 05/01/2015, 1:06 AM

## 2014-05-07 ENCOUNTER — Ambulatory Visit: Payer: BC Managed Care – PPO | Admitting: Diagnostic Neuroimaging

## 2014-05-08 ENCOUNTER — Ambulatory Visit (INDEPENDENT_AMBULATORY_CARE_PROVIDER_SITE_OTHER): Payer: No Typology Code available for payment source

## 2014-05-08 DIAGNOSIS — R930 Abnormal findings on diagnostic imaging of skull and head, not elsewhere classified: Secondary | ICD-10-CM

## 2014-05-08 DIAGNOSIS — R51 Headache: Secondary | ICD-10-CM

## 2014-05-08 DIAGNOSIS — R519 Headache, unspecified: Secondary | ICD-10-CM

## 2014-05-08 DIAGNOSIS — R9089 Other abnormal findings on diagnostic imaging of central nervous system: Secondary | ICD-10-CM

## 2014-05-08 MED ORDER — GADOPENTETATE DIMEGLUMINE 469.01 MG/ML IV SOLN: 15.0000 mL | Freq: Once | INTRAVENOUS | Status: AC | PRN

## 2014-05-14 ENCOUNTER — Other Ambulatory Visit: Payer: Self-pay

## 2014-05-14 DIAGNOSIS — Z1231 Encounter for screening mammogram for malignant neoplasm of breast: Secondary | ICD-10-CM

## 2014-05-21 ENCOUNTER — Ambulatory Visit (INDEPENDENT_AMBULATORY_CARE_PROVIDER_SITE_OTHER): Payer: No Typology Code available for payment source | Admitting: Diagnostic Neuroimaging

## 2014-05-21 ENCOUNTER — Encounter: Payer: Self-pay | Admitting: Diagnostic Neuroimaging

## 2014-05-21 VITALS — BP 126/87 | HR 69 | Temp 98.1°F | Ht 60.0 in | Wt 127.4 lb

## 2014-05-21 DIAGNOSIS — G43009 Migraine without aura, not intractable, without status migrainosus: Secondary | ICD-10-CM

## 2014-05-21 MED ORDER — DICLOFENAC POTASSIUM(MIGRAINE) 50 MG PO PACK: 50.0000 mg | PACK | ORAL | Status: DC | PRN

## 2014-05-21 MED ORDER — RIZATRIPTAN BENZOATE 10 MG PO TBDP: 10.0000 mg | ORAL_TABLET | ORAL | Status: DC | PRN

## 2014-05-21 NOTE — Patient Instructions (Signed)
Diclofenac powder for oral solution What is this medicine? DICLOFENAC (dye KLOE fen ak) is a non-steroidal anti-inflammatory drug (NSAID). It is used to treat migraine pain. This medicine may be used for other purposes; ask your health care provider or pharmacist if you have questions. COMMON BRAND NAME(S): Cambia What should I tell my health care provider before I take this medicine? They need to know if you have any of these conditions: -asthma, especially aspirin sensitive asthma -coronary artery bypass graft (CABG) surgery within the past 2 weeks -drink more than 3 alcohol-containing drinks a day -heart disease or circulation problems like heart failure or leg edema (fluid retention) -high blood pressure -kidney disease -liver disease -phenylketonuria -stomach problems -an unusual or allergic reaction to diclofenac, aspirin, other NSAIDs, other medicines, foods, dyes, or preservatives -pregnant or trying to get pregnant -breast-feeding How should I use this medicine? Mix this medicine with 1 to 2 ounces of water. Drink the medicine and water together. Follow the directions on the prescription label. Do not take your medicine more often than directed. Long-term, continuous use may increase the risk of heart attack or stroke. A special MedGuide will be given to you by the pharmacist with each prescription and refill. Be sure to read this information carefully each time. Talk to your pediatrician regarding the use of this medicine in children. Special care may be needed. Elderly patients over 22 years old may have a stronger reaction and need a smaller dose. Overdosage: If you think you've taken too much of this medicine contact a poison control center or emergency room at once. Overdosage: If you think you have taken too much of this medicine contact a poison control center or emergency room at once. NOTE: This medicine is only for you. Do not share this medicine with others. What if I  miss a dose? This does not apply. What may interact with this medicine? Do not take this medicine with any of the following medications: -cidofovir -ketorolac -methotrexate This medicine may also interact with the following medications: -alcohol -aspirin and aspirin-like medicines -cyclosporine -diuretics -lithium -medicines for blood pressure -medicines for osteoporosis -medicines that affect platelets -medicines that treat or prevent blood clots like warfarin -NSAIDs, medicines for pain and inflammation, like ibuprofen or naproxen -pemetrexed -steroid medicines like prednisone or cortisone This list may not describe all possible interactions. Give your health care provider a list of all the medicines, herbs, non-prescription drugs, or dietary supplements you use. Also tell them if you smoke, drink alcohol, or use illegal drugs. Some items may interact with your medicine. What should I watch for while using this medicine? Tell your doctor or health care professional if your pain does not get better. Talk to your doctor before taking another medicine for pain. Do not treat yourself. This medicine does not prevent heart attack or stroke. In fact, this medicine may increase the chance of a heart attack or stroke. The chance may increase with longer use of this medicine and in people who have heart disease. If you take aspirin to prevent heart attack or stroke, talk with your doctor or health care professional. Do not take medicines such as ibuprofen and naproxen with this medicine. Side effects such as stomach upset, nausea, or ulcers may be more likely to occur. Many medicines available without a prescription should not be taken with this medicine. This medicine can cause ulcers and bleeding in the stomach and intestines at any time during treatment. Do not smoke cigarettes or drink alcohol. These  increase irritation to your stomach and can make it more susceptible to damage from this  medicine. Ulcers and bleeding can happen without warning symptoms and can cause death. You may get drowsy or dizzy. Do not drive, use machinery, or do anything that needs mental alertness until you know how this medicine affects you. Do not stand or sit up quickly, especially if you are an older patient. This reduces the risk of dizzy or fainting spells. This medicine can cause you to bleed more easily. Try to avoid damage to your teeth and gums when you brush or floss your teeth. If you take migraine medicines for 10 or more days a month, your migraines may get worse. Keep a diary of headache days and medicine use. Contact your healthcare professional if your migraine attacks occur more frequently. What side effects may I notice from receiving this medicine? Side effects that you should report to your doctor or health care professional as soon as possible: -allergic reactions like skin rash, itching or hives, swelling of the face, lips, or tongue -black or bloody stools, blood in the urine or vomit -blurred vision -chest pain -difficulty breathing or wheezing -nausea or vomiting -fever -redness, blistering, peeling or loosening of the skin, including inside the mouth -slurred speech or weakness on one side of the body -trouble passing urine or change in the amount of urine -unexplained weight gain or swelling -unusually weak or tired -yellowing of eyes or skin Side effects that usually do not require medical attention (Report these to your doctor or health care professional if they continue or are bothersome.): -constipation -diarrhea -dizziness -headache -heartburn This list may not describe all possible side effects. Call your doctor for medical advice about side effects. You may report side effects to FDA at 1-800-FDA-1088. Where should I keep my medicine? Keep out of the reach of children. Store at room temperature between 15 and 30 degrees C (59 and 86 degrees F). Throw away any  unused medicine after the expiration date. NOTE: This sheet is a summary. It may not cover all possible information. If you have questions about this medicine, talk to your doctor, pharmacist, or health care provider.  2015, Elsevier/Gold Standard. (2012-12-19 10:55:34)  ~~~~~~~~~~~~~~~~~~~~~~~~~~~~~~~~~~~~~~~~~~~~~~~~~~~~~~~~~~~~~~~~~~~~~~~~~~~~~~~~~~~~~~~~~~~~~~~~~~~~~~~~~~~~~~~~~~~~~~~~~~~~~~~~~~~~~~~~~~~~~~~~~~~~~~~~~~~~~~~~~~~~~~~~~~~~~~~~~~~~~~~~~~~~~~~~~~~~~~~~~~~  Rizatriptan disintegrating tablets What is this medicine? RIZATRIPTAN (rye za TRIP tan) is used to treat migraines with or without aura. An aura is a strange feeling or visual disturbance that warns you of an attack. It is not used to prevent migraines. This medicine may be used for other purposes; ask your health care provider or pharmacist if you have questions. COMMON BRAND NAME(S): Maxalt-MLT What should I tell my health care provider before I take this medicine? They need to know if you have any of these conditions: -bowel disease or colitis -diabetes -family history of heart disease -fast or irregular heart beat -heart or blood vessel disease, angina (chest pain), or previous heart attack -high blood pressure -high cholesterol -history of stroke, transient ischemic attacks (TIAs or mini-strokes), or intracranial bleeding -kidney or liver disease -overweight -poor circulation -postmenopausal or surgical removal of uterus and ovaries -an unusual or allergic reaction to rizatriptan, other medicines, foods, dyes, or preservatives -pregnant or trying to get pregnant -breast-feeding How should I use this medicine? Take this medicine by mouth. Follow the directions on the prescription label. This medicine is taken at the first symptoms of a migraine. It is not for everyday use. Leave the tablet in the foil package until you are ready to take it.  Do not push the tablet through the blister pack. Peel open the blister pack  with dry hands and place the tablet on your tongue. The tablet will dissolve rapidly and be swallowed in your saliva. It is not necessary to drink any water to take this medicine. If your migraine headache returns after one dose, you can take another dose as directed. You must leave at least 2 hours between doses, and do not take more than 30 mg total in 24 hours. If there is no improvement at all after the first dose, do not take a second dose without talking to your doctor or health care professional. Do not take your medicine more often than directed. Talk to your pediatrician regarding the use of this medicine in children. While this drug may be prescribed for children as young as 6 years for selected conditions, precautions do apply. Overdosage: If you think you have taken too much of this medicine contact a poison control center or emergency room at once. NOTE: This medicine is only for you. Do not share this medicine with others. What if I miss a dose? This does not apply; this medicine is not for regular use. What may interact with this medicine? Do not take this medicine with any of the following medicines: -amphetamine, dextroamphetamine or cocaine -dihydroergotamine, ergotamine, ergoloid mesylates, methysergide, or ergot-type medication - do not take within 24 hours of taking rizatriptan -feverfew -MAOIs like Carbex, Eldepryl, Marplan, Nardil, and Parnate - do not take rizatriptan within 2 weeks of stopping MAOI therapy. -other migraine medicines like almotriptan, eletriptan, naratriptan, sumatriptan, zolmitriptan - do not take within 24 hours of taking rizatriptan -tryptophan This medicine may also interact with the following medications: -medicines for mental depression, anxiety or mood problems -propranolol This list may not describe all possible interactions. Give your health care provider a list of all the medicines, herbs, non-prescription drugs, or dietary supplements you use. Also  tell them if you smoke, drink alcohol, or use illegal drugs. Some items may interact with your medicine. What should I watch for while using this medicine? Only take this medicine for a migraine headache. Take it if you get warning symptoms or at the start of a migraine attack. It is not for regular use to prevent migraine attacks. You may get drowsy or dizzy. Do not drive, use machinery, or do anything that needs mental alertness until you know how this medicine affects you. To reduce dizzy or fainting spells, do not sit or stand up quickly, especially if you are an older patient. Alcohol can increase drowsiness, dizziness and flushing. Avoid alcoholic drinks. Smoking cigarettes may increase the risk of heart-related side effects from using this medicine. If you take migraine medicines for 10 or more days a month, your migraines may get worse. Keep a diary of headache days and medicine use. Contact your healthcare professional if your migraine attacks occur more frequently. What side effects may I notice from receiving this medicine? Side effects that you should report to your doctor or health care professional as soon as possible: -allergic reactions like skin rash, itching or hives, swelling of the face, lips, or tongue -fast, slow, or irregular heart beat -increased or decreased blood pressure -seizures -severe stomach pain and cramping, bloody diarrhea -signs and symptoms of a blood clot such as breathing problems; changes in vision; chest pain; severe, sudden headache; pain, swelling, warmth in the leg; trouble speaking; sudden numbness or weakness of the face, arm or leg -tingling, pain, or numbness in  the face, hands, or feet Side effects that usually do not require medical attention (report to your doctor or health care professional if they continue or are bothersome): -drowsiness -dry mouth -feeling warm, flushing, or redness of the face -headache -muscle cramps, pain -nausea,  vomiting -unusually weak or tired This list may not describe all possible side effects. Call your doctor for medical advice about side effects. You may report side effects to FDA at 1-800-FDA-1088. Where should I keep my medicine? Keep out of the reach of children. Store at room temperature between 15 and 30 degrees C (59 and 86 degrees F). Protect from light and moisture. Throw away any unused medicine after the expiration date. NOTE: This sheet is a summary. It may not cover all possible information. If you have questions about this medicine, talk to your doctor, pharmacist, or health care provider.  2015, Elsevier/Gold Standard. (2012-12-19 10:17:42)

## 2014-05-21 NOTE — Progress Notes (Signed)
[facility]  PATIENT: Carolyn KirschnerChristy Siu Mccall DOB: 05-02-1976  REFERRING CLINICIAN: C White HISTORY FROM: patient  REASON FOR VISIT: new consult   HISTORICAL  CHIEF COMPLAINT:  Chief Complaint  Patient presents with  . Follow-up    wanting to get MRI results     HISTORY OF PRESENT ILLNESS:   UPDATE 05/21/14 (VRP): Since last visit, HA are stable. MRI done and reviewed. Still trying to conceive. Taking tylenol as needed for HA/pain. HA are mainly before menstrual cycle.  UPDATE 03/04/14 (LL): Since last visit, headaches are unchanged. Could not afford to schedule MRI due to high ins. deductible. Occasionally having sharp occipital pains. She has since married and not preventing pregnancy. Complains of occasional lightheadedness and palpitations. Other times has vertigo sensation and takes meclizine. Denies nausea or vomiting, balance difficulties, phonophobia or photophobia. No changes in vision. Using Flexeril prn for neck pain and stiffness. 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  UPDATE 11/27/13 (LL): Since last visit patient's headaches are unchanged, gabapentin has helped her sleep but has not change the frequency or intensity of the headaches. She is currently having daily headaches, treating them with Tylenol. Sharp occipital pains are intermittent and may last from 5-20 minutes at a time. She is contemplating having a child with her long-term fianc and is currently off birth control. She has a constant feeling of pressure in her neck which is relieved by neck popping. She has neck pain and stiffness, and pain shooting from her neck to in-between her shoulder blades.  PRIOR HPI (08/28/13): 39 year old right-handed female with history of migraine, here for evaluation of headaches. At age 39 years old patient developed new onset of migraine headaches consisting of global headache, nausea, vomiting, photophobia and  phonophobia. Headaches last weeks a time. She was having up to 2 weeks of migraine per month. Sometimes she would pass out with the pain. Patient was tried on Effexor initially but she had intolerance. She was tried on another migraine medication, but doesn't remember the name, and after 2 weeks her migraine significantly improve. Patient stopped that medication and since that time patient has had significant reduction in migraine headaches. Nowadays she only has one severe migraine per year. Over past couple of years patient has had a new type of symptom/headache consisting of intermittent, brief sharp shooting "pinch" pain in the right or left parietal occipital region in the scalp. Symptoms occur without warning. No specific triggers. When the pain hits, she grabs her head and weeks for to go away. Sometimes she has closed eyes because the pain is so severe. No nausea vomiting, photophobia or phonophobia with these new symptoms. 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 Patient is having these twice per week. 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 Patient has some neck pain.   REVIEW OF SYSTEMS: Full 14 system review of systems performed and notable only for constipation nausea dizziness headache syncope (nov 2015) aching muscles neck pain eye pain fatigue.     ALLERGIES: No Known Allergies  HOME MEDICATIONS: Outpatient Prescriptions Prior to Visit  Medication Sig Dispense Refill  . acetaminophen (TYLENOL) 500 MG tablet Take 1,000 mg by mouth every 6 (six) hours as needed. For pain    . cyclobenzaprine (FLEXERIL) 10 MG tablet Take 10 mg by mouth as needed.     .Marland Kitchen  meclizine (ANTIVERT) 25 MG tablet Take 25 mg by mouth 3 (three) times daily as needed for dizziness.    . naproxen (NAPROSYN) 500 MG tablet Take 1 tablet (500 mg total) by mouth every 12 (twelve)  hours as needed. 60 tablet 2  . VITAMIN D, ERGOCALCIFEROL, PO Take 5,000 Units by mouth daily.      No facility-administered medications prior to visit.    PAST MEDICAL HISTORY: Past Medical History  Diagnosis Date  . Syncope   . Migraine   . Fibrocystic breast   . Vitamin D deficiency   . Acid reflux     PAST SURGICAL HISTORY: Past Surgical History  Procedure Laterality Date  . Breast surgery      FAMILY HISTORY: Family History  Problem Relation Age of Onset  . Stroke Father   . Cancer Maternal Aunt   . Cancer Maternal Uncle     SOCIAL HISTORY:  History   Social History  . Marital Status: Single    Spouse Name: N/A    Number of Children: 2  . Years of Education: college   Occupational History  .  [employer]   Social History Main Topics  . Smoking status: Never Smoker   . Smokeless tobacco: Never Used  . Alcohol Use: 0.0 oz/week    0 Not specified per week     Comment: social  . Drug Use: No  . Sexual Activity: Not on file   Other Topics Concern  . Not on file   Social History Narrative   Patient is single with 2 children.   Patient is right handed.   Patient has college education.   Patient drinks 1 cup daily.           PHYSICAL EXAM  Filed Vitals:   05/21/14 1449  BP: 126/87  Pulse: 69  Temp: 98.1 F (36.7 C)  TempSrc: Oral  Height: 5' (1.524 m)  Weight: 127 lb 6.4 oz (57.788 kg)    Not recorded      Body mass index is 24.88 kg/(m^2).  GENERAL EXAM: Patient is in no distress; well developed, nourished and groomed; neck is supple  CARDIOVASCULAR: Regular rate and rhythm, no murmurs, no carotid bruits  NEUROLOGIC: MENTAL STATUS: awake, alert, language fluent, comprehension intact, naming intact, fund of knowledge appropriate CRANIAL NERVE: pupils equal and reactive to light, visual fields full to confrontation, extraocular muscles intact, no nystagmus, facial sensation and strength symmetric, hearing intact, palate elevates  symmetrically, uvula midline, shoulder shrug symmetric, tongue midline. MOTOR: normal bulk and tone, full strength in the BUE, BLE SENSORY: normal and symmetric to light touch COORDINATION: finger-nose-finger, fine finger movements normal REFLEXES: deep tendon reflexes present and symmetric GAIT/STATION: narrow based gait; romberg is negative    DIAGNOSTIC DATA (LABS, IMAGING, TESTING) - I reviewed patient records, labs, notes, testing and imaging myself where available.  Lab Results  Component Value Date   WBC 6.7 08/15/2011   HGB 12.9 08/15/2011   HCT 38.0 08/15/2011   MCV 94.6 08/15/2011   PLT 191 08/15/2011      Component Value Date/Time   NA 141 08/15/2011 1652   K 3.7 08/15/2011 1652   CL 107 08/15/2011 1652   CO2 25 08/15/2011 1638   GLUCOSE 93 08/15/2011 1652   BUN 6 08/15/2011 1652   CREATININE 0.70 08/15/2011 1652   CALCIUM 8.3* 08/15/2011 1638   PROT 7.5 08/25/2009 1510   ALBUMIN 4.1 08/25/2009 1510   AST 20 08/25/2009 1510   ALT 16  08/25/2009 1510   ALKPHOS 45 08/25/2009 1510   BILITOT 0.7 08/25/2009 1510   GFRNONAA >90 08/15/2011 1638   GFRAA >90 08/15/2011 1638   No results found for: CHOL No results found for: HGBA1C No results found for: VITAMINB12 No results found for: TSH  I reviewed images myself and agree with interpretation. -VRP  02/29/12 MRI BRAIN 1. Partially empty sella configuration. This can be a normal anatomic variant, but also can be a sign of idiopathic intracranial hypertension (pseudotumor cerebral). CSF opening pressure would evaluate further.  2. Mildly advanced for age nonspecific small signal change in the cerebral white matter. One area in the left parietal lobe sensory strip which could represent chronic micro hemorrhage, or less likely a small cavernous vascular malformation. Differential considerations include accelerated/hereditary small vessel ischemia, sequelae of trauma, hypercoagulable state, vasculitis, migraines, prior  infection or less likely demyelination.  3. 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  05/08/14 MRI brain  1. Few (4-5) bifrontal juxtacortical and subcortical foci of non-specific gliosis. No abnormal lesions are seen on post contrast views.  2. Small punctate gradient echo hypointensity in the left parietal lobe, may represent chronic cerebral microhemorrhage.  3. No change from MRI on 02/28/12.      ASSESSMENT AND PLAN  39 y.o. 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 Patient did not benefit from trigger point injection 2 years ago. Now trying to conceive, so wants to avoid prophylactic migraine treatments.  PLAN: - trial of rizatriptan and cambia prn  Meds ordered this encounter  Medications  . Diclofenac Potassium (CAMBIA) 50 MG PACK    Sig: Take 50 mg by mouth as needed (for migraine; max 1 packet per day).    Dispense:  9 each    Refill:  3  . rizatriptan (MAXALT-MLT) 10 MG disintegrating tablet    Sig: Take 1 tablet (10 mg total) by mouth as needed for migraine. May repeat in 2 hours if needed    Dispense:  9 tablet    Refill:  11    Return in about 4 months (around 09/19/2014).    Suanne Marker, MD 05/21/2014, 3:25 PM Certified in Neurology, Neurophysiology and Neuroimaging  [facility] [address] [place], [place] [address] [phone]

## 2014-05-23 ENCOUNTER — Ambulatory Visit
Admission: RE | Admit: 2014-05-23 | Discharge: 2014-05-23 | Disposition: A | Discharge status: Home / Self Care | Payer: Self-pay | Source: Ambulatory Visit

## 2014-05-23 DIAGNOSIS — Z1231 Encounter for screening mammogram for malignant neoplasm of breast: Secondary | ICD-10-CM

## 2014-06-03 ENCOUNTER — Encounter: Payer: Self-pay | Admitting: Internal Medicine

## 2014-06-03 ENCOUNTER — Ambulatory Visit (INDEPENDENT_AMBULATORY_CARE_PROVIDER_SITE_OTHER): Payer: Self-pay | Admitting: Internal Medicine

## 2014-06-03 VITALS — BP 139/85 | HR 64 | Ht 60.0 in | Wt 125.0 lb

## 2014-06-03 DIAGNOSIS — R55 Syncope and collapse: Secondary | ICD-10-CM

## 2014-06-03 NOTE — Patient Instructions (Signed)
Your physician recommends that you continue on your current medications as directed. Please refer to the Current Medication list given to you today.  Your physician wants you to follow-up in: 6 months with Dr. Klein. You will receive a reminder letter in the mail two months in advance. If you don't receive a letter, please call our office to schedule the follow-up appointment.  

## 2014-06-03 NOTE — Progress Notes (Signed)
  HPI  Carolyn KirschnerChristy Mccall Carolyn Mccall is a 39 y.o. 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 She has not had anymore problems with dizziness in her increase salt intake.   Has occasional chest pain; Stress echo was normal 5/13    She acknowledges a great deal of psychosocial stress. This relates to work as well as her children and other family issues. She is having problems with headaches. She recently underwent an MRI scan. She had an episode of syncope in November. It had a typical prodrome. The recovery phase was notable for repeat residual orthostatic intolerance and diaphoresis.           Past Medical History  Diagnosis Date  . Syncope   . Migraine   . Fibrocystic breast   . Vitamin D deficiency   . Acid reflux   . Fibrocystic breast changes   . Ulcer of intestine   . Vitamin D deficiency   . Abnormal Pap smear of cervix     Past Surgical History  Procedure Laterality Date  . Breast surgery      breast augmentation  . Wisdom tooth extraction    . Esophagogastroduodenoscopy      Current Outpatient Prescriptions  Medication Sig Dispense Refill  . Diclofenac Potassium (CAMBIA) 50 MG PACK Take 50 mg by mouth as needed (for migraine; max 1 packet per day). 9 each 3  . rizatriptan (MAXALT-MLT) 10 MG disintegrating tablet Take 1 tablet (10 mg total) by mouth as needed for migraine. May repeat in 2 hours if needed 9 tablet 11  . VITAMIN D, ERGOCALCIFEROL, PO Take 5,000 Units by mouth daily.      No current facility-administered medications for this visit.    Allergies  Allergen Reactions  . Effexor [Venlafaxine] Other (See Comments)    Pt states it makes her crazy of she misses a dose    Review of Systems negative except from HPI and PMH  Physical Exam BP 139/85 mmHg  Pulse 64  Ht 5'  (1.524 m)  Wt 125 lb (56.7 kg)  BMI 24.41 kg/m2  LMP 05/21/2014 BP 139/85 mmHg  Pulse 64  Ht 5' (1.524 m)  Wt 125 lb (56.7 kg)  BMI 24.41 kg/m2  LMP 05/21/2014  Well developed and nourished in no acute distress HENT normal Neck supple with JVP-flat Clear Regular rate and rhythm, no murmurs or gallops Abd-soft with active BS No Clubbing cyanosis edema Skin-warm and dry A & Oriented  Grossly normal sensory and motor function  ECG demonstrates sinus rhythm at 61 intervals 04/10/36 Axis 60    Assessment and  Plan  Neurocardiogenic syncope  Elevated blood pressure  We have again reviewed the physiology of neurocardiogenic syncope and its interplay with psychosocial stress.  We will increase her fluid intake and avoid salt increases because of her elevated blood pressure.  She is advised to work on Optician, dispensingstress management

## 2014-06-14 ENCOUNTER — Other Ambulatory Visit: Payer: Self-pay | Admitting: Gastroenterology

## 2014-06-14 DIAGNOSIS — R1011 Right upper quadrant pain: Secondary | ICD-10-CM

## 2014-06-14 DIAGNOSIS — R110 Nausea: Secondary | ICD-10-CM

## 2014-07-08 ENCOUNTER — Encounter (HOSPITAL_COMMUNITY)
Admission: RE | Admit: 2014-07-08 | Discharge: 2014-07-08 | Disposition: A | Discharge status: Home / Self Care | Payer: No Typology Code available for payment source | Source: Ambulatory Visit | Attending: Gastroenterology | Admitting: Gastroenterology

## 2014-07-08 ENCOUNTER — Ambulatory Visit (HOSPITAL_COMMUNITY)
Admission: RE | Admit: 2014-07-08 | Discharge: 2014-07-08 | Disposition: A | Discharge status: Home / Self Care | Payer: No Typology Code available for payment source | Source: Ambulatory Visit | Attending: Gastroenterology | Admitting: Gastroenterology

## 2014-07-08 DIAGNOSIS — R110 Nausea: Secondary | ICD-10-CM | POA: Diagnosis not present

## 2014-07-08 DIAGNOSIS — R1011 Right upper quadrant pain: Secondary | ICD-10-CM | POA: Diagnosis not present

## 2014-07-08 MED ORDER — SINCALIDE 5 MCG IJ SOLR
0.0200 ug/kg | Freq: Once | INTRAMUSCULAR | Status: AC
  Administered 2014-07-08: 1.2 ug via INTRAVENOUS

## 2014-07-08 MED ORDER — TECHNETIUM TC 99M MEBROFENIN IV KIT
4.6000 | PACK | Freq: Once | INTRAVENOUS | Status: AC | PRN
  Administered 2014-07-08: 5 via INTRAVENOUS

## 2014-08-01 ENCOUNTER — Ambulatory Visit: Payer: Self-pay | Admitting: Internal Medicine

## 2014-09-09 LAB — OB RESULTS CONSOLE GC/CHLAMYDIA
CHLAMYDIA, DNA PROBE: NEGATIVE
GC PROBE AMP, GENITAL: NEGATIVE

## 2014-09-09 LAB — OB RESULTS CONSOLE RPR: RPR: NONREACTIVE

## 2014-09-09 LAB — OB RESULTS CONSOLE HIV ANTIBODY (ROUTINE TESTING): HIV: NONREACTIVE

## 2014-09-09 LAB — OB RESULTS CONSOLE HEPATITIS B SURFACE ANTIGEN: HEP B S AG: NEGATIVE

## 2014-09-09 LAB — OB RESULTS CONSOLE RUBELLA ANTIBODY, IGM: RUBELLA: IMMUNE

## 2014-09-19 ENCOUNTER — Ambulatory Visit: Payer: 59 | Admitting: Diagnostic Neuroimaging

## 2015-04-30 ENCOUNTER — Encounter (HOSPITAL_COMMUNITY): Payer: Self-pay | Admitting: *Deleted

## 2015-04-30 ENCOUNTER — Inpatient Hospital Stay (HOSPITAL_COMMUNITY): Payer: No Typology Code available for payment source

## 2015-04-30 ENCOUNTER — Inpatient Hospital Stay (HOSPITAL_COMMUNITY)
Admission: AD | Admit: 2015-04-30 | Discharge: 2015-05-03 | DRG: 775 | Disposition: A | Discharge status: Home / Self Care | Payer: No Typology Code available for payment source | Source: Ambulatory Visit | Attending: Obstetrics and Gynecology | Admitting: Obstetrics and Gynecology

## 2015-04-30 DIAGNOSIS — O99824 Streptococcus B carrier state complicating childbirth: Secondary | ICD-10-CM | POA: Diagnosis present

## 2015-04-30 DIAGNOSIS — K219 Gastro-esophageal reflux disease without esophagitis: Secondary | ICD-10-CM | POA: Diagnosis present

## 2015-04-30 DIAGNOSIS — R55 Syncope and collapse: Secondary | ICD-10-CM | POA: Diagnosis not present

## 2015-04-30 DIAGNOSIS — Z8249 Family history of ischemic heart disease and other diseases of the circulatory system: Secondary | ICD-10-CM | POA: Diagnosis not present

## 2015-04-30 DIAGNOSIS — O09523 Supervision of elderly multigravida, third trimester: Secondary | ICD-10-CM | POA: Diagnosis not present

## 2015-04-30 DIAGNOSIS — O9962 Diseases of the digestive system complicating childbirth: Secondary | ICD-10-CM | POA: Diagnosis present

## 2015-04-30 DIAGNOSIS — Z3A39 39 weeks gestation of pregnancy: Secondary | ICD-10-CM

## 2015-04-30 DIAGNOSIS — Z823 Family history of stroke: Secondary | ICD-10-CM

## 2015-04-30 LAB — CBC
HEMATOCRIT: 39.2 % (ref 36.0–46.0)
Hemoglobin: 13 g/dL (ref 12.0–15.0)
MCH: 31.6 pg (ref 26.0–34.0)
MCHC: 33.2 g/dL (ref 30.0–36.0)
MCV: 95.4 fL (ref 78.0–100.0)
PLATELETS: 215 10*3/uL (ref 150–400)
RBC: 4.11 MIL/uL (ref 3.87–5.11)
RDW: 14.3 % (ref 11.5–15.5)
WBC: 8.5 10*3/uL (ref 4.0–10.5)

## 2015-04-30 LAB — URINALYSIS, ROUTINE W REFLEX MICROSCOPIC
BILIRUBIN URINE: NEGATIVE
Glucose, UA: NEGATIVE mg/dL
KETONES UR: NEGATIVE mg/dL
Nitrite: NEGATIVE
PROTEIN: NEGATIVE mg/dL
Specific Gravity, Urine: <1.005 — ABNORMAL LOW (ref 1.005–1.030)
pH: 6 (ref 5.0–8.0)

## 2015-04-30 LAB — URINE MICROSCOPIC-ADD ON
Bacteria, UA: NONE SEEN
RBC / HPF: NONE SEEN RBC/hpf (ref 0–5)

## 2015-04-30 LAB — TYPE AND SCREEN
ABO/RH(D): B POS
Antibody Screen: NEGATIVE

## 2015-04-30 LAB — ABO/RH: ABO/RH(D): B POS

## 2015-04-30 MED ORDER — LACTATED RINGERS IV SOLN
INTRAVENOUS | Status: DC
  Administered 2015-04-30: 17:00:00 via INTRAVENOUS

## 2015-04-30 MED ORDER — OXYTOCIN 40 UNITS IN LACTATED RINGERS INFUSION - SIMPLE MED: 1.0000 m[IU]/min | INTRAVENOUS | Status: DC

## 2015-04-30 MED ORDER — OXYTOCIN 40 UNITS IN LACTATED RINGERS INFUSION - SIMPLE MED
62.5000 mL/h | INTRAVENOUS | Status: DC
  Administered 2015-05-01: 62.5 mL/h via INTRAVENOUS
  Filled 2015-04-30: qty 1000

## 2015-04-30 MED ORDER — DEXTROSE 5 % IV SOLN
5.0000 10*6.[IU] | Freq: Once | INTRAVENOUS | Status: AC
  Administered 2015-04-30: 5 10*6.[IU] via INTRAVENOUS
  Filled 2015-04-30: qty 5

## 2015-04-30 MED ORDER — TERBUTALINE SULFATE 1 MG/ML IJ SOLN
0.2500 mg | Freq: Once | INTRAMUSCULAR | Status: DC | PRN
  Filled 2015-04-30: qty 1

## 2015-04-30 MED ORDER — LACTATED RINGERS IV SOLN: 500.0000 mL | INTRAVENOUS | Status: DC | PRN

## 2015-04-30 MED ORDER — OXYCODONE-ACETAMINOPHEN 5-325 MG PO TABS: 2.0000 | ORAL_TABLET | ORAL | Status: DC | PRN

## 2015-04-30 MED ORDER — OXYTOCIN BOLUS FROM INFUSION: 500.0000 mL | INTRAVENOUS | Status: DC

## 2015-04-30 MED ORDER — MISOPROSTOL 25 MCG QUARTER TABLET
25.0000 ug | ORAL_TABLET | ORAL | Status: DC | PRN
  Administered 2015-04-30: 25 ug via VAGINAL
  Filled 2015-04-30: qty 1
  Filled 2015-04-30: qty 0.25

## 2015-04-30 MED ORDER — CITRIC ACID-SODIUM CITRATE 334-500 MG/5ML PO SOLN: 30.0000 mL | ORAL | Status: DC | PRN

## 2015-04-30 MED ORDER — ONDANSETRON HCL 4 MG/2ML IJ SOLN: 4.0000 mg | Freq: Four times a day (QID) | INTRAMUSCULAR | Status: DC | PRN

## 2015-04-30 MED ORDER — LIDOCAINE HCL (PF) 1 % IJ SOLN
30.0000 mL | INTRAMUSCULAR | Status: AC | PRN
  Administered 2015-05-01: 30 mL via SUBCUTANEOUS
  Filled 2015-04-30: qty 30

## 2015-04-30 MED ORDER — PENICILLIN G POTASSIUM 5000000 UNITS IJ SOLR
2.5000 10*6.[IU] | INTRAVENOUS | Status: DC
  Administered 2015-04-30: 2.5 10*6.[IU] via INTRAVENOUS
  Filled 2015-04-30 (×6): qty 2.5

## 2015-04-30 MED ORDER — OXYCODONE-ACETAMINOPHEN 5-325 MG PO TABS: 1.0000 | ORAL_TABLET | ORAL | Status: DC | PRN

## 2015-04-30 MED ORDER — ACETAMINOPHEN 325 MG PO TABS: 650.0000 mg | ORAL_TABLET | ORAL | Status: DC | PRN

## 2015-04-30 MED ORDER — NALBUPHINE HCL 10 MG/ML IJ SOLN
5.0000 mg | INTRAMUSCULAR | Status: DC | PRN
  Administered 2015-04-30: 5 mg via INTRAVENOUS
  Filled 2015-04-30: qty 1

## 2015-04-30 MED ORDER — OXYTOCIN 40 UNITS IN LACTATED RINGERS INFUSION - SIMPLE MED
1.0000 m[IU]/min | INTRAVENOUS | Status: DC
  Administered 2015-04-30: 1 m[IU]/min via INTRAVENOUS
  Administered 2015-05-01: 666 m[IU]/min via INTRAVENOUS

## 2015-04-30 NOTE — Progress Notes (Signed)
Carolyn RamusChristy Siu Mccall MRN: [id number]  Subjective: -Nurse call reports SROM ~30 minutes ago.  Patient reports that she is coping well with contractions, but questions when process will be over.  Patient further reports that "water keeps coming out."  Objective: BP 125/74 mmHg  Pulse 60  Temp(Src) 98 F (36.7 C) (Oral)  Resp 18  Ht 5' (1.524 m)  Wt 69.854 kg (154 lb)  BMI 30.08 kg/m2  LMP 07/31/2014      Fetal Monitoring: FHT: 145 bpm, Mod Var, +Variable x1, Early Decels, +Accels UC: Q3-544min, palpates moderate    Vaginal Exam: SVE:   Dilation: 1.5 Effacement (%): 70 Station: -2 Exam by:: M.Merrill, RN Membranes: SROM Internal Monitors: None  Augmentation/Induction: Pitocin: Initiated Cytotec: S/P One Dose  Assessment:  IUP at 39wks Cat I FT  GBS Negative IOL   Plan: -Will start pitocin at 451mUn/min and hold -Patient informed of POC and availability of IV pain medications -Continue other mgmt as ordered  Valma CavaJessica L Moris Ratchford,MSN, CNM 04/30/2015, 10:31 PM

## 2015-04-30 NOTE — Progress Notes (Signed)
Dayna RamusChristy Siu Warwick MRN: [id number]  Subjective: -Care assumed of 39y.o. [id number] who presents for IOL secondary to failed antenatal testing.  In room to meet acquaintance and patient resting in bed.  Reports that she is coping well with contractions and does not desire an epidural.  Patient has questions regarding induction process.   Objective: BP 125/74 mmHg  Pulse 60  Temp(Src) 97.9 F (36.6 C) (Oral)  Resp 18  Ht 5' (1.524 m)  Wt 69.854 kg (154 lb)  BMI 30.08 kg/m2  LMP 07/31/2014      Fetal Monitoring: FHT: 145 bpm, Mod Var, +Variable Decels x One, +Accels UC: Q3-605min, palpates moderate    Vaginal Exam: SVE: Deferred Membranes:Intact Internal Monitors: None  Augmentation/Induction: Pitocin:None Cytotec: S/P One dose at 1830  Assessment:  IUP at 39wks Cat I FT  IOL GBS Positive  Plan: -Discussed r/b of induction including fetal distress, serial induction, pain, and increased risk of c/s delivery -Discussed induction methods including cervical ripening agents, foley bulbs, and pitocin -Questions and concerns addressed -Patient informed of POC  -Informed of light labor diet -Will reassess at 2230 -Continue other mgmt as ordered  Valma CavaJessica L Verna Hamon,MSN, CNM 04/30/2015, 7:56 PM

## 2015-04-30 NOTE — MAU Provider Note (Signed)
Carolyn RamusChristy Siu Peabody is a 39 y.o. G4 P3 at 39.0 weeks presents with decreased FM and spotting   History     Patient Active Problem List   Diagnosis Date Noted  . Anxiety 06/30/2012  . Chest pain 06/30/2012  . Elevated blood pressure 04/07/2012  . Syncope, vasovagal 01/01/2011  . Polyuria 01/01/2011  . Palpitations 01/01/2011    No chief complaint on file.  HPI  OB History    Gravida Para Term Preterm AB TAB SAB Ectopic Multiple Living   4 3 3       3       Past Medical History  Diagnosis Date  . Syncope   . Migraine   . Fibrocystic breast   . Vitamin D deficiency   . Acid reflux   . Fibrocystic breast changes   . Ulcer of intestine   . Vitamin D deficiency   . Abnormal Pap smear of cervix     Past Surgical History  Procedure Laterality Date  . Breast surgery      breast augmentation  . Wisdom tooth extraction    . Esophagogastroduodenoscopy      Family History  Problem Relation Age of Onset  . Stroke Father   . Heart disease Father   . Hypertension Father   . Cancer Maternal Aunt   . Cancer Maternal Uncle   . Hypertension Mother   . Heart disease Brother     Social History  Substance Use Topics  . Smoking status: Never Smoker   . Smokeless tobacco: Never Used  . Alcohol Use: 0.0 oz/week    0 Jeramey Lanuza drinks or equivalent per week     Comment: social    Allergies:  Allergies  Allergen Reactions  . Effexor [Venlafaxine] Other (See Comments)    Pt states it makes her crazy of she misses a dose    Prescriptions prior to admission  Medication Sig Dispense Refill Last Dose  . Prenatal Vit-Fe Fumarate-FA (PRENATAL MULTIVITAMIN) TABS tablet Take 1 tablet by mouth daily at 12 noon.   04/29/2015 at Unknown time  . Diclofenac Potassium (CAMBIA) 50 MG PACK Take 50 mg by mouth as needed (for migraine; max 1 packet per day). (Patient not taking: Reported on 04/30/2015) 9 each 3 Taking  . rizatriptan (MAXALT-MLT) 10 MG disintegrating tablet Take 1 tablet  (10 mg total) by mouth as needed for migraine. May repeat in 2 hours if needed (Patient not taking: Reported on 04/30/2015) 9 tablet 11 Taking    ROS See HPI above, all other systems are negative  Physical Exam   Blood pressure 128/66, pulse 66, temperature 97.9 F (36.6 C), temperature source Oral, resp. rate 18, last menstrual period 07/31/2014.  Physical Exam Ext:  WNL ABD: Soft, non tender to palpation, no rebound or guarding SVE: c/t/h   ED Course  Assessment: IUP at  39.0weeks Membranes: intact FHR: Category 1 CTX:  occasional   Plan: OB US    Solace Wendorff, CNM, MSN 04/30/2015. 12:38 PM   US report BPP 6/8, AFI 6.15 Dr Su Hiltoberts informed. Admit for IOL,

## 2015-04-30 NOTE — MAU Note (Signed)
Notified provider that patient presents with c/o decreased fetal movement and vaginal bleeding that started this morning. Provider said she would come see patient.

## 2015-04-30 NOTE — MAU Note (Signed)
Pt presents to MAU with complaints of a decrease in fetal movement. Reports vaginal bleeding that started this morning.

## 2015-04-30 NOTE — Progress Notes (Signed)
Dayna RamusChristy Siu Faires MRN: [id number]  Subjective: -Nurse call reports patient c/o inability to cope and FHT with variable decelerations.  Instructed to discontinue pitocin and given IV pain medication if patient does not desire epidural.  In room to assess after request for assistance made.  Patient reportedly with syncope s/p nubain dosing. IV bolus infusing and O2 being applied.  Smelling salts utilized and patient aware of person and place.  Objective: BP 131/72 mmHg  Pulse 71  Temp(Src) 97.9 F (36.6 C) (Oral)  Resp 18  Ht 5' (1.524 m)  Wt 69.854 kg (154 lb)  BMI 30.08 kg/m2  SpO2 100%  LMP 07/31/2014      Fetal Monitoring: FHT: 140 bpm, Mod Var, -Decels, +Accels UC: Q1-643min, palpates moderate to strong    Vaginal Exam: SVE:   Dilation: 6 Effacement (%): 70 Station: -2 Exam by:: J.Sofhia Ulibarri, CNM Membranes:SROM Internal Monitors: None  Augmentation/Induction: Pitocin:D/C'd Cytotec: S/P One Dose  Assessment:  IUP at 39wks Cat II FT  IOL Syncope Incident  Plan: -Utilization of position changes and interventions with improvement in fetal tracing -Room prepped for delivery as SVD anticipated -Continue other mgmt as ordered  Valma CavaJessica L Beautifull Cisar,MSN, CNM 04/30/2015, 11:45 PM

## 2015-04-30 NOTE — H&P (Signed)
Carolyn Mccall is a 39 y.o. female, G4 P3003 at 39.0 weeks  Patient Active Problem List   Diagnosis Date Noted  . Anxiety 06/30/2012  . Chest pain 06/30/2012  . Elevated blood pressure 04/07/2012  . Syncope, vasovagal 01/01/2011  . Polyuria 01/01/2011  . Palpitations 01/01/2011    Pregnancy Course: Patient entered care at 8 weeks.   EDC of 05/07/15   was established by US.      US evaluations:   19.2 weeks - Anatomy: EFW 11oz - 67%, FHR 143, cervix 3.71, vertex, anterior placenta, female       Significant prenatal events:    Last evaluation:   38.6 weeks   VE:0/40/-3 on 04/29/15  Reason for admission:  IOL for BPP 6/10  Pt States:   Contractions Frequency: none         Contraction severity: n/a         Fetal activity: currently +  OB History    Gravida Para Term Preterm AB TAB SAB Ectopic Multiple Living   4 3 3       3      Past Medical History  Diagnosis Date  . Syncope   . Migraine   . Fibrocystic breast   . Vitamin D deficiency   . Acid reflux   . Fibrocystic breast changes   . Ulcer of intestine   . Vitamin D deficiency   . Abnormal Pap smear of cervix    Past Surgical History  Procedure Laterality Date  . Breast surgery      breast augmentation  . Wisdom tooth extraction    . Esophagogastroduodenoscopy     Family History: family history includes Cancer in her maternal aunt and maternal uncle; Heart disease in her brother and father; Hypertension in her father and mother; Stroke in her father. Social History:  reports that she has never smoked. She has never used smokeless tobacco. She reports that she drinks alcohol. She reports that she does not use illicit drugs.   Prenatal Transfer Tool  Maternal Diabetes: No Genetic Screening: Normal Maternal Ultrasounds/Referrals: Normal Fetal Ultrasounds or other Referrals:  None Maternal Substance Abuse:  No Significant Maternal Medications:  None Significant Maternal Lab Results: Lab values include:  Group B Strep positive   ROS:  See HPI above, all other systems are negative  Allergies  Allergen Reactions  . Effexor [Venlafaxine] Other (See Comments)    Pt states it makes her crazy of she misses a dose      Blood pressure 128/66, pulse 66, temperature 97.9 F (36.6 C), temperature source Oral, resp. rate 18, last menstrual period 07/31/2014.  Maternal Exam:  Uterine Assessment: Contraction frequency is rare.  Abdomen: Gravid, non tender. Fundal height is aga.  Normal external genitalia, vulva, cervix, uterus and adnexa.  No lesions noted on exam.  Pelvis adequate for delivery.  Fetal presentation: Vertex by Leopold's   Fetal Exam:  Monitor Surveillance : Continuous Monitoring Mode: Ultrasound.  NICHD: Category 1 CTXs: Q 10minutes EFW   7 lbs  Physical Exam: Nursing note and vitals reviewed General: alert and cooperative She appears well nourished Psychiatric: Normal mood and affect. Her behavior is normal Head: Normocephalic Eyes: Pupils are equal, round, and reactive to light Neck: Normal range of motion Cardiovascular: RRR without murmur  Respiratory: CTAB. Effort normal  Abd: soft, non-tender, +BS, no rebound, no guarding  Genitourinary: Vagina normal  Neurological: A&Ox3 Skin: Warm and dry  Musculoskeletal: Normal range of motion  Homan's sign negative bilaterally  No evidence of DVTs.  Edema: inimal bilaterally non-pitting edema DTR: 2+ Clonus: None   Prenatal labs: ABO, Rh:  B positive Antibody:   negative Rubella:  immune RPR:    NR HBsAg:   negative HIV:   NR GBS:  12/9/16positive,   09/09/14 negative Sickle cell/Hgb electrophoresis:  WNL Pap: wnl GC:   negative Chlamydia:  negative Genetic screenings:  wnl Glucola:  127  Assessment:  IUP at 39.0 weeks NICHD: Category1 Membranes: intact Bishop Score: 3 GBS positive IOL options reviewed with patient including cytotec, foley bulb, AROM, and pitocin R&B of IOL reviewed including serial  induction, failure, and/or CS requirement Pain management options reviewed Pt and family verbalize understanding, agrees with treatment plan  and wishes to proceed with induction process    Plan:  Admit to L&D for IOL d/t BPP 6/10 Start induction with cytotec  GBS prophylaxis with PCN G per Ralphine Hinks dosing with onset of active labor.  Okay to ambulate around unit with wireless monitors  Okay to get up and shower without monitoring  Regular diet prior to starting pitocin Clear/Thin diet after pitocin starts  Pt will be reassess 4 hrs after cytotec placement.  POC will be revaluated at that time  Continue with labor mgmt as ordered  IV pain medication per orders PRN Epidural per patient request Foley cath after patient is comfortable with epidural  Anticipate SVD Attending MD available at all times.   Ronnetta Currington, CNM, MSN 04/30/2015, 4:24 PM

## 2015-05-01 ENCOUNTER — Encounter (HOSPITAL_COMMUNITY): Payer: Self-pay

## 2015-05-01 LAB — CBC
HEMATOCRIT: 35.5 % — AB (ref 36.0–46.0)
HEMOGLOBIN: 11.7 g/dL — AB (ref 12.0–15.0)
MCH: 31.4 pg (ref 26.0–34.0)
MCHC: 33 g/dL (ref 30.0–36.0)
MCV: 95.2 fL (ref 78.0–100.0)
Platelets: 188 10*3/uL (ref 150–400)
RBC: 3.73 MIL/uL — ABNORMAL LOW (ref 3.87–5.11)
RDW: 14.3 % (ref 11.5–15.5)
WBC: 13.6 10*3/uL — ABNORMAL HIGH (ref 4.0–10.5)

## 2015-05-01 LAB — RPR: RPR: NONREACTIVE

## 2015-05-01 MED ORDER — ONDANSETRON HCL 4 MG PO TABS: 4.0000 mg | ORAL_TABLET | ORAL | Status: DC | PRN

## 2015-05-01 MED ORDER — ZOLPIDEM TARTRATE 5 MG PO TABS: 5.0000 mg | ORAL_TABLET | Freq: Every evening | ORAL | Status: DC | PRN

## 2015-05-01 MED ORDER — IBUPROFEN 600 MG PO TABS
600.0000 mg | ORAL_TABLET | Freq: Four times a day (QID) | ORAL | Status: DC
  Administered 2015-05-01 – 2015-05-03 (×10): 600 mg via ORAL
  Filled 2015-05-01 (×10): qty 1

## 2015-05-01 MED ORDER — OXYCODONE-ACETAMINOPHEN 5-325 MG PO TABS
1.0000 | ORAL_TABLET | ORAL | Status: DC | PRN
  Administered 2015-05-01: 1 via ORAL
  Filled 2015-05-01: qty 1

## 2015-05-01 MED ORDER — LANOLIN HYDROUS EX OINT: TOPICAL_OINTMENT | CUTANEOUS | Status: DC | PRN

## 2015-05-01 MED ORDER — BENZOCAINE-MENTHOL 20-0.5 % EX AERO
1.0000 "application " | INHALATION_SPRAY | CUTANEOUS | Status: DC | PRN
  Administered 2015-05-01: 1 via TOPICAL
  Filled 2015-05-01: qty 56

## 2015-05-01 MED ORDER — DIPHENHYDRAMINE HCL 25 MG PO CAPS: 25.0000 mg | ORAL_CAPSULE | Freq: Four times a day (QID) | ORAL | Status: DC | PRN

## 2015-05-01 MED ORDER — WITCH HAZEL-GLYCERIN EX PADS: 1.0000 "application " | MEDICATED_PAD | CUTANEOUS | Status: DC | PRN

## 2015-05-01 MED ORDER — ONDANSETRON HCL 4 MG/2ML IJ SOLN: 4.0000 mg | INTRAMUSCULAR | Status: DC | PRN

## 2015-05-01 MED ORDER — PRENATAL MULTIVITAMIN CH
1.0000 | ORAL_TABLET | Freq: Every day | ORAL | Status: DC
  Administered 2015-05-01 – 2015-05-03 (×3): 1 via ORAL
  Filled 2015-05-01 (×3): qty 1

## 2015-05-01 MED ORDER — OXYCODONE-ACETAMINOPHEN 5-325 MG PO TABS: 2.0000 | ORAL_TABLET | ORAL | Status: DC | PRN

## 2015-05-01 MED ORDER — TETANUS-DIPHTH-ACELL PERTUSSIS 5-2.5-18.5 LF-MCG/0.5 IM SUSP
0.5000 mL | Freq: Once | INTRAMUSCULAR | Status: AC
Start: 2015-05-02 — End: 2015-05-01
  Administered 2015-05-01: 0.5 mL via INTRAMUSCULAR
  Filled 2015-05-01: qty 0.5

## 2015-05-01 MED ORDER — ACETAMINOPHEN 325 MG PO TABS
650.0000 mg | ORAL_TABLET | ORAL | Status: DC | PRN
  Administered 2015-05-01: 650 mg via ORAL
  Filled 2015-05-01: qty 2

## 2015-05-01 MED ORDER — DIBUCAINE 1 % RE OINT: 1.0000 "application " | TOPICAL_OINTMENT | RECTAL | Status: DC | PRN

## 2015-05-01 MED ORDER — SIMETHICONE 80 MG PO CHEW: 80.0000 mg | CHEWABLE_TABLET | ORAL | Status: DC | PRN

## 2015-05-01 MED ORDER — SENNOSIDES-DOCUSATE SODIUM 8.6-50 MG PO TABS
2.0000 | ORAL_TABLET | ORAL | Status: DC
  Administered 2015-05-02 (×2): 2 via ORAL
  Filled 2015-05-01 (×2): qty 2

## 2015-05-01 NOTE — Progress Notes (Signed)
Carolyn Mccall   Subjective: Post Partum Day 0 Vaginal delivery, 2 degree laceration Patient up ad lib, denies syncope or dizziness. Reports consuming regular diet without issues and denies N/V No issues with urination and reports bleeding is appropriate  Feeding:  Breast & bottle Contraceptive plan:   IUD  Objective: Temp:  [97.9 F (36.6 C)-98.1 F (36.7 C)] 98.1 F (36.7 C) (12/29 0750) Pulse Rate:  [45-139] 59 (12/29 0750) Resp:  [16-20] 16 (12/29 0750) BP: (93-146)/(34-88) 105/56 mmHg (12/29 0750) SpO2:  [84 %-100 %] 97 % (12/29 0058) Weight:  [154 lb (69.854 kg)] 154 lb (69.854 kg) (12/28 1743)  Physical Exam:  General: alert and cooperative Ext: WNL, no significant  edema. No evidence of DVT seen on physical exam. Breast: Soft filling Lungs: CTAB Heart RRR without murmur  Abdomen:  Soft, fundus firm, lochia scant, + bowel sounds, non distended, non tender Lochia: appropriate Uterine Fundus: firm Laceration: healing well    Recent Labs  04/30/15 1655 05/01/15 0505  HGB 13.0 11.7*  HCT 39.2 35.5*    Assessment S/P Vaginal Delivery-Day 0 Stable  Normal Involution Breastfeeding/Bottlefeeding   Plan: Continue current care Breastfeeding and Lactation consult Lactation support   Kweku Stankey, CNM, MSN 05/01/2015, 9:33 AM

## 2015-05-01 NOTE — Progress Notes (Signed)
MOB was referred for history of depression/anxiety. * Referral screened out by Clinical Social Worker because none of the following criteria appear to apply: ~ History of anxiety/depression during this pregnancy, or of post-partum depression. ~ Diagnosis of anxiety and/or depression within last 3 years OR * MOB's symptoms currently being treated with medication and/or therapy. Please contact the Clinical Social Worker if needs arise, or if MOB requests.  [facility]

## 2015-05-02 NOTE — Progress Notes (Signed)
Subjective: Postpartum Day 1: Vaginal delivery, 2nd perineal laceration Patient up ad lib, reports no syncope or dizziness. Feeding:  Breast/Bottle Contraceptive plan:  IUD- potentially Mirena  Objective: Vital signs in last 24 hours: Temp:  [97.8 F (36.6 C)-98.2 F (36.8 C)] 98 F (36.7 C) (12/30 0626) Pulse Rate:  [55-64] 58 (12/30 0626) Resp:  [16-18] 18 (12/30 0626) BP: (114-122)/(62-67) 122/67 mmHg (12/30 0626) SpO2:  [99 %] 99 % (12/29 1902)  Physical Exam:  General: alert and cooperative Lochia: appropriate Uterine Fundus: firm Perineum: healing well DVT Evaluation: No evidence of DVT seen on physical exam. Negative Homan's sign.   CBC Latest Ref Rng 05/01/2015 04/30/2015 08/15/2011  WBC 4.0 - 10.5 K/uL 13.6(H) 8.5 -  Hemoglobin 12.0 - 15.0 g/dL 11.7(L) 13.0 12.9  Hematocrit 36.0 - 46.0 % 35.5(L) 39.2 38.0  Platelets 150 - 400 K/uL 188 215 -     Assessment/Plan: Status post vaginal delivery day 1. Stable Normal Involution Continue current care. May discharge today, pending infant discharge     Beatrix FettersRachel StallCNM 05/02/2015, 11:29 AM

## 2015-05-02 NOTE — Lactation Note (Signed)
This note was copied from the chart of Carolyn Cleopatra CedarChristy Roper. Lactation Consultation Note  Initial consult done.  Breastfeeding consultation services and support information given and reviewed with mom.  She desires to both breast and formula feeding.  She states she puts baby to breast first and then supplements with formula.  Recommended weaning off of formula as breasts fill.  Mom states latch is sometimes painful.  Discussed importance of deep latch with lips flanged.  Encouraged to call for concerns or latch assist.  Patient Name: Carolyn Mccall Reason for consult: Initial assessment   Maternal Data Formula Feeding for Exclusion: Yes Reason for exclusion: Mother's choice to formula and breast feed on admission Does the patient have breastfeeding experience prior to this delivery?: Yes  Feeding Feeding Type: Bottle Fed - Formula Nipple Type: Slow - flow  LATCH Score/Interventions                      Lactation Tools Discussed/Used     Consult Status Consult Status: Follow-up Date: 05/03/15 Follow-up type: In-patient    Huston FoleyMOULDEN, Farrie Sann S Mccall, 1:24 PM

## 2015-05-03 MED ORDER — IBUPROFEN 600 MG PO TABS: 600.0000 mg | ORAL_TABLET | Freq: Four times a day (QID) | ORAL | Status: DC

## 2015-05-03 NOTE — Lactation Note (Signed)
This note was copied from the chart of Carolyn Mccall. Lactation Consultation Note; 2 week [employer] rental completed. No questions at present. To call prn  Patient Name: Carolyn Cleopatra CedarChristy Krack ZOXWR'UToday's Date: 05/03/2015 Reason for consult: Follow-up assessment;Breast surgery   Maternal Data Formula Feeding for Exclusion: Yes Reason for exclusion: Mother's choice to formula and breast feed on admission Has patient been taught Hand Expression?: Yes Does the patient have breastfeeding experience prior to this delivery?: Yes  Feeding Feeding Type: Bottle Fed - Breast Milk Nipple Type: Slow - flow  LATCH Score/Interventions       Type of Nipple: Everted at rest and after stimulation  Comfort (Breast/Nipple): Filling, red/small blisters or bruises, mild/mod discomfort           Lactation Tools Discussed/Used WIC Program: No Pump Review: Setup, frequency, and cleaning   Consult Status Consult Status: Follow-up Date: 05/03/15 Follow-up type: In-patient    Pamelia HoitWeeks, Alaira Level D 05/03/2015, 11:56 AM

## 2015-05-03 NOTE — Discharge Instructions (Signed)
Postpartum Care After Vaginal Delivery °After you deliver your newborn (postpartum period), the usual stay in the hospital is 24-72 hours. If there were problems with your labor or delivery, or if you have other medical problems, you might be in the hospital longer.  °While you are in the hospital, you will receive help and instructions on how to care for yourself and your newborn during the postpartum period.  °While you are in the hospital: °· Be sure to tell your nurses if you have pain or discomfort, as well as where you feel the pain and what makes the pain worse. °· If you had an incision made near your vagina (episiotomy) or if you had some tearing during delivery, the nurses may put ice packs on your episiotomy or tear. The ice packs may help to reduce the pain and swelling. °· If you are breastfeeding, you may feel uncomfortable contractions of your uterus for a couple of weeks. This is normal. The contractions help your uterus get back to normal size. °· It is normal to have some bleeding after delivery. °· For the first 1-3 days after delivery, the flow is red and the amount may be similar to a period. °· It is common for the flow to start and stop. °· In the first few days, you may pass some small clots. Let your nurses know if you begin to pass large clots or your flow increases. °· Do not  flush blood clots down the toilet before having the nurse look at them. °· During the next 3-10 days after delivery, your flow should become more watery and pink or brown-tinged in color. °· Ten to fourteen days after delivery, your flow should be a small amount of yellowish-white discharge. °· The amount of your flow will decrease over the first few weeks after delivery. Your flow may stop in 6-8 weeks. Most women have had their flow stop by 12 weeks after delivery. °· You should change your sanitary pads frequently. °· Wash your hands thoroughly with soap and water for at least 20 seconds after changing pads, using  the toilet, or before holding or feeding your newborn. °· You should feel like you need to empty your bladder within the first 6-8 hours after delivery. °· In case you become weak, lightheaded, or faint, call your nurse before you get out of bed for the first time and before you take a shower for the first time. °· Within the first few days after delivery, your breasts may begin to feel tender and full. This is called engorgement. Breast tenderness usually goes away within 48-72 hours after engorgement occurs. You may also notice milk leaking from your breasts. If you are not breastfeeding, do not stimulate your breasts. Breast stimulation can make your breasts produce more milk. °· Spending as much time as possible with your newborn is very important. During this time, you and your newborn can feel close and get to know each other. Having your newborn stay in your room (rooming in) will help to strengthen the bond with your newborn.  It will give you time to get to know your newborn and become comfortable caring for your newborn. °· Your hormones change after delivery. Sometimes the hormone changes can temporarily cause you to feel sad or tearful. These feelings should not last more than a few days. If these feelings last longer than that, you should talk to your caregiver. °· If desired, talk to your caregiver about methods of family planning or contraception. °·   Talk to your caregiver about immunizations. Your caregiver may want you to have the following immunizations before leaving the hospital: °· Tetanus, diphtheria, and pertussis (Tdap) or tetanus and diphtheria (Td) immunization. It is very important that you and your family (including grandparents) or others caring for your newborn are up-to-date with the Tdap or Td immunizations. The Tdap or Td immunization can help protect your newborn from getting ill. °· Rubella immunization. °· Varicella (chickenpox) immunization. °· Influenza immunization. You should  receive this annual immunization if you did not receive the immunization during your pregnancy. °  °This information is not intended to replace advice given to you by your health care provider. Make sure you discuss any questions you have with your health care provider. °  °Document Released: 02/14/2007 Document Revised: 01/12/2012 Document Reviewed: 12/15/2011 °Elsevier Interactive Patient Education ©2016 [employer] ° °Iron-Rich Diet °Iron is a mineral that helps your body to produce hemoglobin. Hemoglobin is a protein in your red blood cells that carries oxygen to your body's tissues. Eating too little iron may cause you to feel weak and tired, and it can increase your risk for infection. Eating enough iron is necessary for your body's metabolism, muscle function, and nervous system. °Iron is naturally found in many foods. It can also be added to foods or fortified in foods. There are two types of dietary iron: °· Heme iron. Heme iron is absorbed by the body more easily than nonheme iron. Heme iron is found in meat, poultry, and fish. °· Nonheme iron. Nonheme iron is found in dietary supplements, iron-fortified grains, beans, and vegetables. °You may need to follow an iron-rich diet if: °· You have been diagnosed with iron deficiency or iron-deficiency anemia. °· You have a condition that prevents you from absorbing dietary iron, such as: °¨ Infection in your intestines. °¨ Celiac disease. This involves long-lasting (chronic) inflammation of your intestines. °· You do not eat enough iron. °· You eat a diet that is high in foods that impair iron absorption. °· You have lost a lot of blood. °· You have heavy bleeding during your menstrual cycle. °· You are pregnant. °WHAT IS MY PLAN? °Your health care provider may help you to determine how much iron you need per day based on your condition. Generally, when a person consumes sufficient amounts of iron in the diet, the following iron needs are met: °· Men. °¨ 14-18  years old: 11 mg per day. °¨ 19-50 years old: 8 mg per day. °· Women.   °¨ 14-18 years old: 15 mg per day. °¨ 19-50 years old: 18 mg per day. °¨ Over 50 years old: 8 mg per day. °¨ Pregnant women: 27 mg per day. °¨ Breastfeeding women: 9 mg per day. °WHAT DO I NEED TO KNOW ABOUT AN IRON-RICH DIET? °· Eat fresh fruits and vegetables that are high in vitamin C along with foods that are high in iron. This will help increase the amount of iron that your body absorbs from food, especially with foods containing nonheme iron. Foods that are high in vitamin C include oranges, peppers, tomatoes, and mango. °· Take iron supplements only as directed by your health care provider. Overdose of iron can be life-threatening. If you were prescribed iron supplements, take them with orange juice or a vitamin C supplement. °· Cook foods in pots and pans that are made from iron.   °· Eat nonheme iron-containing foods alongside foods that are high in heme iron. This helps to improve your iron absorption.   °·   Certain foods and drinks contain compounds that impair iron absorption. Avoid eating these foods in the same meal as iron-rich foods or with iron supplements. These include: °¨ Coffee, black tea, and red wine. °¨ Milk, dairy products, and foods that are high in calcium. °¨ Beans, soybeans, and peas. °¨ Whole grains. °· When eating foods that contain both nonheme iron and compounds that impair iron absorption, follow these tips to absorb iron better.   °¨ Soak beans overnight before cooking. °¨ Soak whole grains overnight and drain them before using. °¨ Ferment flours before baking, such as using yeast in bread dough. °WHAT FOODS CAN I EAT? °Grains  °Iron-fortified breakfast cereal. Iron-fortified whole-wheat bread. Enriched rice. Sprouted grains. °Vegetables  °Spinach. Potatoes with skin. Green peas. Broccoli. Red and green bell peppers. Fermented vegetables. °Fruits  °Prunes. Raisins. Oranges. Strawberries. Mango.  Grapefruit. °Meats and Other Protein Sources  °Beef liver. Oysters. Beef. Shrimp. Turkey. Chicken. Tuna. Sardines. Chickpeas. Nuts. Tofu. °Beverages  °Tomato juice. Fresh orange juice. Prune juice. Hibiscus tea. Fortified instant breakfast shakes. °Condiments  °Tahini. Fermented soy sauce.  °Sweets and Desserts  °Black-strap molasses.  °Other  °Wheat germ. °The items listed above may not be a complete list of recommended foods or beverages. Contact your dietitian for more options.  °WHAT FOODS ARE NOT RECOMMENDED? °Grains  °Whole grains. Bran cereal. Bran flour. Oats. °Vegetables  °Artichokes. Brussels sprouts. Kale. °Fruits  °Blueberries. Raspberries. Strawberries. Figs. °Meats and Other Protein Sources  °Soybeans. Products made from soy protein. °Dairy  °Milk. Cream. Cheese. Yogurt. Cottage cheese. °Beverages  °Coffee. Black tea. Red wine. °Sweets and Desserts  °Cocoa. Chocolate. Ice cream. °Other  °Basil. Oregano. Parsley. °The items listed above may not be a complete list of foods and beverages to avoid. Contact your dietitian for more information.  °  °This information is not intended to replace advice given to you by your health care provider. Make sure you discuss any questions you have with your health care provider. °  °Document Released: 12/01/2004 Document Revised: 05/10/2014 Document Reviewed: 11/14/2013 °Elsevier Interactive Patient Education ©2016 Elsevier Inc. ° °

## 2015-05-03 NOTE — Discharge Summary (Signed)
OB Discharge Summary     Patient Name: Carolyn Mccall DOB: 04-Nov-1975 MRN: [id number]  Date of admission: 04/30/2015 Delivering MD: Gerrit HeckEMLY, JESSICA   Date of discharge: 05/03/2015  Admitting diagnosis: 39wks, bleeding Intrauterine pregnancy: 6680w1d     Secondary diagnosis:  Principal Problem:   Normal vaginal delivery Active Problems:   Syncope, vasovagal   Second-degree perineal laceration, with delivery  Additional problems: none     Discharge diagnosis: Term Pregnancy Delivered                                                                                                Post partum procedures:none  Augmentation: Pitocin and Cytotec  Complications: None  Hospital course:  Induction of Labor With Vaginal Delivery   39 y.o. yo G4P4001 at 4980w1d was admitted to the hospital 04/30/2015 for induction of labor.  Indication for induction: BPP 6/10.  Patient had an uncomplicated labor course as follows: Membrane Rupture Time/Date: 10:07 PM ,04/30/2015   Intrapartum Procedures: Episiotomy: None [1]                                         Lacerations:  2nd degree [3]  Patient had delivery of a Viable infant.  Information for the patient's newborn:  Carolyn Mccall, Carolyn Mccall [[id number]][[id number]]  Delivery Method: Vag-Spont    05/01/2015  Details of delivery can be found in separate delivery note.  Patient had a routine postpartum course. Patient is discharged home 05/03/2015.   Physical exam  Filed Vitals:   05/01/15 1902 05/02/15 0626 05/02/15 1732 05/03/15 0548  BP: 119/66 122/67 127/72 125/73  Pulse: 55 58 70 59  Temp: 97.8 F (36.6 C) 98 F (36.7 C) 98.1 F (36.7 C) 97.8 F (36.6 C)  TempSrc: Oral Oral Oral Oral  Resp: 18 18 18 18   Height:      Weight:      SpO2: 99%      General: alert and cooperative Lochia: appropriate Uterine Fundus: firm Laceration: Healing well with no significant drainage DVT Evaluation: No evidence of DVT seen on physical exam. Negative  Homan's sign. Labs: Lab Results  Component Value Date   WBC 13.6* 05/01/2015   HGB 11.7* 05/01/2015   HCT 35.5* 05/01/2015   MCV 95.2 05/01/2015   PLT 188 05/01/2015   CMP Latest Ref Rng 08/15/2011  Glucose 70 - 99 mg/dL 93  BUN 6 - 23 mg/dL 6  Creatinine 0.100.50 - 2.721.10 mg/dL 5.360.70  Sodium 644135 - 034145 mEq/L 141  Potassium 3.5 - 5.1 mEq/L 3.7  Chloride 96 - 112 mEq/L 107  CO2 19 - 32 mEq/L -  Calcium 8.4 - 10.5 mg/dL -  Total Protein 6.0 - 8.3 g/dL -  Total Bilirubin 0.3 - 1.2 mg/dL -  Alkaline Phos 39 - 742117 U/L -  AST 0 - 37 U/L -  ALT 0 - 35 U/L -    Discharge instruction: per After Visit Summary and "Baby and Me Booklet". Included instructions regarding Iron Rich foods.  After visit meds:    Medication List    STOP taking these medications        Diclofenac Potassium 50 MG Pack  Commonly known as:  CAMBIA     rizatriptan 10 MG disintegrating tablet  Commonly known as:  MAXALT-MLT      TAKE these medications        ibuprofen 600 MG tablet  Commonly known as:  ADVIL,MOTRIN  Take 1 tablet (600 mg total) by mouth every 6 (six) hours.     prenatal multivitamin Tabs tablet  Take 1 tablet by mouth daily at 12 noon.        Diet: routine diet  Activity: Advance as tolerated. Pelvic rest for 6 weeks.   Outpatient follow OZ:[facility] to schedule postpartum vist at 5 weeks and a contraception visit one week later at 6 weeks Follow up Appt:No future appointments. Follow up Visit:No Follow-up on file.  Postpartum contraception: IUD Mirena  Newborn Data: Live born female  Birth Weight: 7 lb 2.1 oz (3235 g) APGAR: 8, 9  Baby Feeding: Bottle and Breast Disposition:home with mother   05/03/2015 Carolyn Mccall, CNM [phone]

## 2015-05-03 NOTE — Lactation Note (Signed)
This note was copied from the chart of Carolyn Cleopatra CedarChristy Stalvey. Lactation Consultation Note; Mom reports pain with latch- reviewed wide open mouth and keeping the baby close to the breast throughout the feeding. Baby just had formula. Encouraged to always breast feed first then offer formula if baby is still hungry. Reports breasts are feeling fuller this morning- encouraged to pump since baby just fed. Had breast augmentation 12 years ago. Has insurance- encouraged to call them about a pump- offered 2 week rental and mom agreeable. Left paperwork with mom. No questions at present. To call prn  Patient Name: Carolyn Cleopatra CedarChristy Housand KGURK'YToday's Date: 05/03/2015 Reason for consult: Follow-up assessment;Breast surgery   Maternal Data Formula Feeding for Exclusion: Yes Reason for exclusion: Mother's choice to formula and breast feed on admission Has patient been taught Hand Expression?: Yes Does the patient have breastfeeding experience prior to this delivery?: Yes  Feeding    LATCH Score/Interventions       Type of Nipple: Everted at rest and after stimulation  Comfort (Breast/Nipple): Filling, red/small blisters or bruises, mild/mod discomfort           Lactation Tools Discussed/Used WIC Program: No Pump Review: Setup, frequency, and cleaning   Consult Status Consult Status: Follow-up Date: 05/03/15 Follow-up type: In-patient    Carolyn Mccall, Carolyn Mccall 05/03/2015, 8:52 AM

## 2015-08-04 DIAGNOSIS — M62838 Other muscle spasm: Secondary | ICD-10-CM | POA: Diagnosis not present

## 2015-08-06 DIAGNOSIS — M62838 Other muscle spasm: Secondary | ICD-10-CM | POA: Diagnosis not present

## 2015-08-07 ENCOUNTER — Other Ambulatory Visit: Payer: Self-pay | Admitting: Gastroenterology

## 2015-08-07 DIAGNOSIS — K219 Gastro-esophageal reflux disease without esophagitis: Secondary | ICD-10-CM | POA: Diagnosis not present

## 2015-08-07 DIAGNOSIS — K5900 Constipation, unspecified: Secondary | ICD-10-CM | POA: Diagnosis not present

## 2015-08-07 DIAGNOSIS — Z30430 Encounter for insertion of intrauterine contraceptive device: Secondary | ICD-10-CM | POA: Diagnosis not present

## 2015-08-07 DIAGNOSIS — R1310 Dysphagia, unspecified: Secondary | ICD-10-CM

## 2015-08-11 ENCOUNTER — Other Ambulatory Visit: Payer: No Typology Code available for payment source

## 2015-08-11 ENCOUNTER — Ambulatory Visit
Admission: RE | Admit: 2015-08-11 | Discharge: 2015-08-11 | Disposition: A | Discharge status: Home / Self Care | Payer: BLUE CROSS/BLUE SHIELD | Source: Ambulatory Visit | Attending: Gastroenterology | Admitting: Gastroenterology

## 2015-08-11 DIAGNOSIS — R1310 Dysphagia, unspecified: Secondary | ICD-10-CM

## 2015-08-11 DIAGNOSIS — M62838 Other muscle spasm: Secondary | ICD-10-CM | POA: Diagnosis not present

## 2015-08-13 DIAGNOSIS — M62838 Other muscle spasm: Secondary | ICD-10-CM | POA: Diagnosis not present

## 2015-08-18 DIAGNOSIS — Z0000 Encounter for general adult medical examination without abnormal findings: Secondary | ICD-10-CM | POA: Diagnosis not present

## 2015-08-18 DIAGNOSIS — Z13220 Encounter for screening for lipoid disorders: Secondary | ICD-10-CM | POA: Diagnosis not present

## 2015-08-18 DIAGNOSIS — M25561 Pain in right knee: Secondary | ICD-10-CM | POA: Diagnosis not present

## 2015-08-18 DIAGNOSIS — H9312 Tinnitus, left ear: Secondary | ICD-10-CM | POA: Diagnosis not present

## 2015-08-20 DIAGNOSIS — M62838 Other muscle spasm: Secondary | ICD-10-CM | POA: Diagnosis not present

## 2015-08-21 DIAGNOSIS — M62838 Other muscle spasm: Secondary | ICD-10-CM | POA: Diagnosis not present

## 2015-08-29 DIAGNOSIS — M62838 Other muscle spasm: Secondary | ICD-10-CM | POA: Diagnosis not present

## 2015-09-04 DIAGNOSIS — M62838 Other muscle spasm: Secondary | ICD-10-CM | POA: Diagnosis not present

## 2015-09-08 DIAGNOSIS — M62838 Other muscle spasm: Secondary | ICD-10-CM | POA: Diagnosis not present

## 2015-09-10 DIAGNOSIS — M62838 Other muscle spasm: Secondary | ICD-10-CM | POA: Diagnosis not present

## 2015-09-15 DIAGNOSIS — M62838 Other muscle spasm: Secondary | ICD-10-CM | POA: Diagnosis not present

## 2015-09-18 DIAGNOSIS — Z30431 Encounter for routine checking of intrauterine contraceptive device: Secondary | ICD-10-CM | POA: Diagnosis not present

## 2015-09-18 DIAGNOSIS — T8389XA Other specified complication of genitourinary prosthetic devices, implants and grafts, initial encounter: Secondary | ICD-10-CM | POA: Diagnosis not present

## 2015-09-22 DIAGNOSIS — M62838 Other muscle spasm: Secondary | ICD-10-CM | POA: Diagnosis not present

## 2015-09-24 DIAGNOSIS — M62838 Other muscle spasm: Secondary | ICD-10-CM | POA: Diagnosis not present

## 2015-09-26 DIAGNOSIS — R030 Elevated blood-pressure reading, without diagnosis of hypertension: Secondary | ICD-10-CM | POA: Diagnosis not present

## 2015-09-26 DIAGNOSIS — R5383 Other fatigue: Secondary | ICD-10-CM | POA: Diagnosis not present

## 2015-09-30 DIAGNOSIS — M654 Radial styloid tenosynovitis [de Quervain]: Secondary | ICD-10-CM | POA: Diagnosis not present

## 2015-09-30 DIAGNOSIS — M62838 Other muscle spasm: Secondary | ICD-10-CM | POA: Diagnosis not present

## 2015-10-17 DIAGNOSIS — M25532 Pain in left wrist: Secondary | ICD-10-CM | POA: Diagnosis not present

## 2015-11-17 DIAGNOSIS — R030 Elevated blood-pressure reading, without diagnosis of hypertension: Secondary | ICD-10-CM | POA: Diagnosis not present

## 2015-11-17 DIAGNOSIS — G43909 Migraine, unspecified, not intractable, without status migrainosus: Secondary | ICD-10-CM | POA: Diagnosis not present

## 2015-11-17 DIAGNOSIS — K219 Gastro-esophageal reflux disease without esophagitis: Secondary | ICD-10-CM | POA: Diagnosis not present

## 2015-11-18 DIAGNOSIS — M62838 Other muscle spasm: Secondary | ICD-10-CM | POA: Diagnosis not present

## 2015-11-24 DIAGNOSIS — M62838 Other muscle spasm: Secondary | ICD-10-CM | POA: Diagnosis not present

## 2015-12-09 DIAGNOSIS — M62838 Other muscle spasm: Secondary | ICD-10-CM | POA: Diagnosis not present

## 2015-12-24 ENCOUNTER — Ambulatory Visit (INDEPENDENT_AMBULATORY_CARE_PROVIDER_SITE_OTHER): Payer: BLUE CROSS/BLUE SHIELD | Admitting: Diagnostic Neuroimaging

## 2015-12-24 ENCOUNTER — Encounter: Payer: Self-pay | Admitting: Diagnostic Neuroimaging

## 2015-12-24 VITALS — BP 136/78 | HR 59 | Wt 143.2 lb

## 2015-12-24 DIAGNOSIS — R51 Headache: Secondary | ICD-10-CM | POA: Diagnosis not present

## 2015-12-24 DIAGNOSIS — G43009 Migraine without aura, not intractable, without status migrainosus: Secondary | ICD-10-CM

## 2015-12-24 DIAGNOSIS — R519 Headache, unspecified: Secondary | ICD-10-CM

## 2015-12-24 MED ORDER — RIZATRIPTAN BENZOATE 10 MG PO TBDP: 10.0000 mg | ORAL_TABLET | ORAL | 11 refills | Status: DC | PRN

## 2015-12-24 MED ORDER — ESCITALOPRAM OXALATE 10 MG PO TABS: 10.0000 mg | ORAL_TABLET | Freq: Every day | ORAL | 12 refills | Status: DC

## 2015-12-24 NOTE — Patient Instructions (Addendum)
Thank you for coming to see [place] at [facility]. I hope we have been able to provide you high quality care today.  You may receive a patient satisfaction survey over the next few weeks. We would appreciate your feedback and comments so that we may continue to improve ourselves and the health of our patients.   - trial of lexapro for migraine prevention and anxiety treatment (previously had good response)  - trial of rizatriptan as needed    ~~~~~~~~~~~~~~~~~~~~~~~~~~~~~~~~~~~~~~~~~~~~~~~~~~~~~~~~~~~~~~~~~  DR. Ellamae Lybeck'S GUIDE TO HAPPY AND HEALTHY LIVING These are some of my general health and wellness recommendations. Some of them may apply to you better than others. Please use common sense as you try these suggestions and feel free to ask me any questions.   ACTIVITY/FITNESS Mental, social, emotional and physical stimulation are very important for brain and body health. Try learning a new activity (arts, music, language, sports, games).  Keep moving your body to the best of your abilities. You can do this at home, inside or outside, the park, community center, gym or anywhere you like. Consider a physical therapist or personal trainer to get started. Consider the app Sworkit. Fitness trackers such as smart-watches, smart-phones or Fitbits can help as well.   NUTRITION Eat more plants: colorful vegetables, nuts, seeds and berries.  Eat less sugar, salt, preservatives and processed foods.  Avoid toxins such as cigarettes and alcohol.  Drink water when you are thirsty. Warm water with a slice of lemon is an excellent morning drink to start the day.  Consider these websites for more information The Nutrition Source (https://www.henry-hernandez.biz/) Precision Nutrition (WindowBlog.ch)   RELAXATION Consider practicing mindfulness meditation or other relaxation techniques such as deep breathing, prayer, yoga, tai chi,  massage. See website mindful.org or the apps Headspace or Calm to help get started.   SLEEP Try to get at least 7-8+ hours sleep per day. Regular exercise and reduced caffeine will help you sleep better. Practice good sleep hygeine techniques. See website sleep.org for more information.   PLANNING Prepare estate planning, living will, healthcare POA documents. Sometimes this is best planned with the help of an attorney. Theconversationproject.org and agingwithdignity.org are excellent resources.

## 2015-12-24 NOTE — Progress Notes (Signed)
[facility]  PATIENT: Carolyn RamusChristy Siu Martinec DOB: 07/26/1975  REFERRING CLINICIAN: C White HISTORY FROM: patient  REASON FOR VISIT: follow up    HISTORICAL  CHIEF COMPLAINT:  Chief Complaint  Patient presents with  . Migraine    rm 6, "migraines/headaches ever since I delivered 7 mos ago, light sensitive, I take Ibuprofen which helps; I black out-go blank sometimes; I don't pass out"  . Follow-up    last seen 05/2014    HISTORY OF PRESENT ILLNESS:   UPDATE 12/24/15: Since last visit, had a baby girl (healthy), but since then now with more HA. Daily HA, global pain, eye pain, photosensitive. Now on daily ibuprofen.   UPDATE 05/21/14 ([facility]): Since last visit, HA are stable. MRI done and reviewed. Still trying to conceive. Taking tylenol as needed for HA/pain. HA are mainly before menstrual cycle.  UPDATE 03/04/14 (LL): Since last visit, headaches are unchanged. Could not afford to schedule MRI due to high ins. deductible. Occasionally having sharp occipital pains. She has since married and not preventing pregnancy. Complains of occasional lightheadedness and palpitations. Other times has vertigo sensation and takes meclizine. Denies nausea or vomiting, balance difficulties, phonophobia or photophobia. No changes in vision. Using Flexeril prn for neck pain and stiffness. 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  UPDATE 11/27/13 (LL): Since last visit patient's headaches are unchanged, gabapentin has helped her sleep but has not change the frequency or intensity of the headaches. She is currently having daily headaches, treating them with Tylenol. Sharp occipital pains are intermittent and may last from 5-20 minutes at a time. She is contemplating having a child with her long-term fianc and is currently off birth control. She has a constant feeling of pressure in her neck which is relieved by neck popping. She has neck  pain and stiffness, and pain shooting from her neck to in-between her shoulder blades.  PRIOR HPI (08/28/13): 40 year old right-handed female with history of migraine, here for evaluation of headaches. At age 40 years old patient developed new onset of migraine headaches consisting of global headache, nausea, vomiting, photophobia and phonophobia. Headaches last weeks a time. She was having up to 2 weeks of migraine per month. Sometimes she would pass out with the pain. Patient was tried on Effexor initially but she had intolerance. She was tried on another migraine medication, but doesn't remember the name, and after 2 weeks her migraine significantly improve. Patient stopped that medication and since that time patient has had significant reduction in migraine headaches. Nowadays she only has one severe migraine per year. Over past couple of years patient has had a new type of symptom/headache consisting of intermittent, brief sharp shooting "pinch" pain in the right or left parietal occipital region in the scalp. Symptoms occur without warning. No specific triggers. When the pain hits, she grabs her head and weeks for to go away. Sometimes she has closed eyes because the pain is so severe. No nausea vomiting, photophobia or phonophobia with these new symptoms. 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 Patient is having these twice per week. 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 Patient has some neck pain.   REVIEW OF SYSTEMS: Full 14 system review of systems performed and negative except: constipation nausea dizziness headache syncope (nov 2015) aching muscles neck pain eye pain fatigue.  ALLERGIES: Allergies  Allergen Reactions  . Effexor [Venlafaxine] Other (See Comments)    Pt states it makes her crazy of she misses a dose    HOME MEDICATIONS: Outpatient  Medications Prior to Visit  Medication Sig Dispense Refill  . ibuprofen (ADVIL,MOTRIN) 600 MG tablet Take 1 tablet (600 mg total) by mouth every 6 (six) hours. 30 tablet 2  . Prenatal Vit-Fe Fumarate-FA (PRENATAL MULTIVITAMIN) TABS tablet Take 1 tablet by mouth daily at 12 noon.     No facility-administered medications prior to visit.     PAST MEDICAL HISTORY: Past Medical History:  Diagnosis Date  . Abnormal Pap smear of cervix   . Acid reflux   . Fibrocystic breast   . Fibrocystic breast changes   . Migraine   . Syncope   . Ulcer of intestine   . Vitamin D deficiency   . Vitamin D deficiency     PAST SURGICAL HISTORY: Past Surgical History:  Procedure Laterality Date  . BREAST SURGERY     breast augmentation  . ESOPHAGOGASTRODUODENOSCOPY    . WISDOM TOOTH EXTRACTION      FAMILY HISTORY: Family History  Problem Relation Age of Onset  . Stroke Father   . Heart disease Father   . Hypertension Father   . Hypertension Mother   . Heart disease Brother   . Cancer Maternal Aunt   . Cancer Maternal Uncle     SOCIAL HISTORY:  Social History   Social History  . Marital status: Single    Spouse name: N/A  . Number of children: 2  . Years of education: college   Occupational History  .  [employer]   Social History Main Topics  . Smoking status: Never Smoker  . Smokeless tobacco: Never Used  . Alcohol use 0.0 oz/week     Comment: social  . Drug use: No  . Sexual activity: Not on file   Other Topics Concern  . Not on file   Social History Narrative   Patient is single with 2 children.   Patient is right handed.   Patient has college education.   Patient drinks 1 cup daily.           PHYSICAL EXAM  Vitals:   12/24/15 1415  BP: 136/78  Pulse: (!) 59  Weight: 143 lb 3.2 oz (65 kg)    Not recorded      Body mass index is 27.97 kg/m.  GENERAL EXAM: Patient is in no distress; well developed, nourished and groomed; neck is  supple  CARDIOVASCULAR: Regular rate and rhythm, no murmurs, no carotid bruits  NEUROLOGIC: MENTAL STATUS: awake, alert, language fluent, comprehension intact, naming intact, fund of knowledge appropriate CRANIAL NERVE: pupils equal and reactive to light, visual fields full to confrontation, extraocular muscles intact, no nystagmus, facial sensation and strength symmetric, hearing intact, palate elevates symmetrically, uvula midline, shoulder shrug symmetric, tongue midline. MOTOR: normal bulk and tone, full strength in the BUE, BLE SENSORY: normal and symmetric to light touch COORDINATION: finger-nose-finger, fine finger movements normal REFLEXES: deep tendon reflexes present and symmetric GAIT/STATION: narrow based gait; romberg is negative    DIAGNOSTIC DATA (LABS, IMAGING, TESTING) - I reviewed patient records, labs, notes, testing and imaging myself where available.  Lab Results  Component Value Date   WBC 13.6 (H) 05/01/2015   HGB 11.7 (L) 05/01/2015   HCT 35.5 (L) 05/01/2015   MCV 95.2 05/01/2015   PLT 188 05/01/2015      Component Value Date/Time  NA 141 08/15/2011 1652   K 3.7 08/15/2011 1652   CL 107 08/15/2011 1652   CO2 25 08/15/2011 1638   GLUCOSE 93 08/15/2011 1652   BUN 6 08/15/2011 1652   CREATININE 0.70 08/15/2011 1652   CALCIUM 8.3 (L) 08/15/2011 1638   PROT 7.5 08/25/2009 1510   ALBUMIN 4.1 08/25/2009 1510   AST 20 08/25/2009 1510   ALT 16 08/25/2009 1510   ALKPHOS 45 08/25/2009 1510   BILITOT 0.7 08/25/2009 1510   GFRNONAA >90 08/15/2011 1638   GFRAA >90 08/15/2011 1638   No results found for: CHOL No results found for: HGBA1C No results found for: VITAMINB12 No results found for: TSH   02/29/12 MRI BRAIN 1. Partially empty sella configuration. This can be a normal anatomic variant, but also can be a sign of idiopathic intracranial hypertension (pseudotumor cerebral). CSF opening pressure would evaluate further.  2. Mildly advanced for age  nonspecific small signal change in the cerebral white matter. One area in the left parietal lobe sensory strip which could represent chronic micro hemorrhage, or less likely a small cavernous vascular malformation. Differential considerations include accelerated/hereditary small vessel ischemia, sequelae of trauma, hypercoagulable state, vasculitis, migraines, prior infection or less likely demyelination.  3. 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  05/08/14 MRI brain  1. Few (4-5) bifrontal juxtacortical and subcortical foci of non-specific gliosis. No abnormal lesions are seen on post contrast views.  2. Small punctate gradient echo hypointensity in the left parietal lobe, may represent chronic cerebral microhemorrhage.  3. No change from MRI on 02/28/12.      ASSESSMENT AND PLAN  40 y.o. 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 Patient did not benefit from trigger point injection 2 years ago.    Dx:  Migraine without aura and without status migrainosus, not intractable  Chronic daily headache     PLAN: - trial of lexapro for migraine prevention and anxiety treatment (previously had good response) - trial of rizatriptan as needed  Meds ordered this encounter  Medications  . escitalopram (LEXAPRO) 10 MG tablet    Sig: Take 1 tablet (10 mg total) by mouth daily.    Dispense:  30 tablet    Refill:  12  . rizatriptan (MAXALT-MLT) 10 MG disintegrating tablet    Sig: Take 1 tablet (10 mg total) by mouth as needed for migraine. May repeat in 2 hours if needed    Dispense:  9 tablet    Refill:  11   Return in about 4 months (around 04/24/2016).    Suanne MarkerVIKRAM R. PENUMALLI, MD 12/24/2015, 2:41 PM Certified in Neurology, Neurophysiology and Neuroimaging  [facility] [address], [address] [place], [place] [address] [phone]

## 2015-12-25 ENCOUNTER — Telehealth: Payer: Self-pay | Admitting: Diagnostic Neuroimaging

## 2015-12-25 NOTE — Telephone Encounter (Signed)
Pt called to advise escitalopram (LEXAPRO) 10 MG tablet is making her stomach hurt. She has taken 2 doses. Please call

## 2015-12-26 NOTE — Telephone Encounter (Signed)
LVM advising patient to take medication with solid foods and to give her system more time to adjust to the medication. left name, number for further questions. Informed her of office hours today.

## 2016-01-20 DIAGNOSIS — R1013 Epigastric pain: Secondary | ICD-10-CM | POA: Diagnosis not present

## 2016-01-20 DIAGNOSIS — H04562 Stenosis of left lacrimal punctum: Secondary | ICD-10-CM | POA: Diagnosis not present

## 2016-01-20 DIAGNOSIS — K219 Gastro-esophageal reflux disease without esophagitis: Secondary | ICD-10-CM | POA: Diagnosis not present

## 2016-01-20 DIAGNOSIS — K5900 Constipation, unspecified: Secondary | ICD-10-CM | POA: Diagnosis not present

## 2016-01-23 DIAGNOSIS — I10 Essential (primary) hypertension: Secondary | ICD-10-CM | POA: Diagnosis not present

## 2016-01-23 DIAGNOSIS — Z23 Encounter for immunization: Secondary | ICD-10-CM | POA: Diagnosis not present

## 2016-01-27 DIAGNOSIS — N644 Mastodynia: Secondary | ICD-10-CM | POA: Diagnosis not present

## 2016-02-17 DIAGNOSIS — K219 Gastro-esophageal reflux disease without esophagitis: Secondary | ICD-10-CM | POA: Diagnosis not present

## 2016-02-17 DIAGNOSIS — R1011 Right upper quadrant pain: Secondary | ICD-10-CM | POA: Diagnosis not present

## 2016-02-17 DIAGNOSIS — R1013 Epigastric pain: Secondary | ICD-10-CM | POA: Diagnosis not present

## 2016-02-17 DIAGNOSIS — K5904 Chronic idiopathic constipation: Secondary | ICD-10-CM | POA: Diagnosis not present

## 2016-02-20 DIAGNOSIS — M654 Radial styloid tenosynovitis [de Quervain]: Secondary | ICD-10-CM | POA: Diagnosis not present

## 2016-03-02 ENCOUNTER — Telehealth: Payer: Self-pay | Admitting: *Deleted

## 2016-03-02 MED ORDER — ESCITALOPRAM OXALATE 10 MG PO TABS: 10.0000 mg | ORAL_TABLET | Freq: Every day | ORAL | 3 refills | Status: DC

## 2016-03-02 NOTE — Telephone Encounter (Signed)
90 day supply lexapro requested; e scribed.

## 2016-03-03 DIAGNOSIS — J069 Acute upper respiratory infection, unspecified: Secondary | ICD-10-CM | POA: Diagnosis not present

## 2016-03-15 ENCOUNTER — Other Ambulatory Visit: Payer: Self-pay | Admitting: Gastroenterology

## 2016-03-15 DIAGNOSIS — R1013 Epigastric pain: Secondary | ICD-10-CM

## 2016-03-19 DIAGNOSIS — M654 Radial styloid tenosynovitis [de Quervain]: Secondary | ICD-10-CM | POA: Diagnosis not present

## 2016-03-19 DIAGNOSIS — M25532 Pain in left wrist: Secondary | ICD-10-CM | POA: Diagnosis not present

## 2016-04-01 DIAGNOSIS — Z6827 Body mass index (BMI) 27.0-27.9, adult: Secondary | ICD-10-CM | POA: Diagnosis not present

## 2016-04-01 DIAGNOSIS — Z113 Encounter for screening for infections with a predominantly sexual mode of transmission: Secondary | ICD-10-CM | POA: Diagnosis not present

## 2016-04-01 DIAGNOSIS — Z1231 Encounter for screening mammogram for malignant neoplasm of breast: Secondary | ICD-10-CM | POA: Diagnosis not present

## 2016-04-01 DIAGNOSIS — Z01419 Encounter for gynecological examination (general) (routine) without abnormal findings: Secondary | ICD-10-CM | POA: Diagnosis not present

## 2016-04-08 ENCOUNTER — Encounter (HOSPITAL_COMMUNITY): Payer: BLUE CROSS/BLUE SHIELD

## 2016-04-08 ENCOUNTER — Ambulatory Visit (HOSPITAL_COMMUNITY): Payer: BLUE CROSS/BLUE SHIELD

## 2016-04-14 ENCOUNTER — Ambulatory Visit (INDEPENDENT_AMBULATORY_CARE_PROVIDER_SITE_OTHER): Payer: BLUE CROSS/BLUE SHIELD | Admitting: Diagnostic Neuroimaging

## 2016-04-14 ENCOUNTER — Encounter: Payer: Self-pay | Admitting: Diagnostic Neuroimaging

## 2016-04-14 VITALS — BP 136/91 | HR 73 | Wt 143.0 lb

## 2016-04-14 DIAGNOSIS — G43009 Migraine without aura, not intractable, without status migrainosus: Secondary | ICD-10-CM

## 2016-04-14 MED ORDER — RIZATRIPTAN BENZOATE 10 MG PO TBDP: 10.0000 mg | ORAL_TABLET | ORAL | 11 refills | Status: DC | PRN

## 2016-04-14 NOTE — Progress Notes (Signed)
[facility]  PATIENT: Carolyn Mccall DOB: Dec 17, 1975  REFERRING CLINICIAN: C White HISTORY FROM: patient REASON FOR VISIT: follow up   HISTORICAL  CHIEF COMPLAINT:  Chief Complaint  Patient presents with  . Migraine    rm 6, "occas pain on back of head, pain on R side with pressure"  . Follow-up    4 month    HISTORY OF PRESENT ILLNESS:   UPDATE 04/14/16 ([facility]): Since last visit, HA were improved, until few weeks ago. Also now with increased job stress. Also on the way here, found out her friend just died. Tred lexapro for a few days in Aug 2017, but stopped due to GI pain / side effect. Rizatriptan as needed helps for severe headaches.   UPDATE 12/24/15: Since last visit, had a baby girl (healthy), but since then now with more HA. Daily HA, global pain, eye pain, photosensitive. Now on daily ibuprofen.   UPDATE 05/21/14 ([facility]): Since last visit, HA are stable. MRI done and reviewed. Still trying to conceive. Taking tylenol as needed for HA/pain. HA are mainly before menstrual cycle.  UPDATE 03/04/14 (LL): Since last visit, headaches are unchanged. Could not afford to schedule MRI due to high ins. deductible. Occasionally having sharp occipital pains. She has since married and not preventing pregnancy. Complains of occasional lightheadedness and palpitations. Other times has vertigo sensation and takes meclizine. Denies nausea or vomiting, balance difficulties, phonophobia or photophobia. No changes in vision. Using Flexeril prn for neck pain and stiffness. 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  UPDATE 11/27/13 (LL): Since last visit patient's headaches are unchanged, gabapentin has helped her sleep but has not change the frequency or intensity of the headaches. She is currently having daily headaches, treating them with Tylenol. Sharp occipital pains are intermittent and may last from 5-20 minutes at a  time. She is contemplating having a child with her long-term fianc and is currently off birth control. She has a constant feeling of pressure in her neck which is relieved by neck popping. She has neck pain and stiffness, and pain shooting from her neck to in-between her shoulder blades.  PRIOR HPI (08/28/13): 40 year old right-handed female with history of migraine, here for evaluation of headaches. At age 40 years old patient developed new onset of migraine headaches consisting of global headache, nausea, vomiting, photophobia and phonophobia. Headaches last weeks a time. She was having up to 2 weeks of migraine per month. Sometimes she would pass out with the pain. Patient was tried on Effexor initially but she had intolerance. She was tried on another migraine medication, but doesn't remember the name, and after 2 weeks her migraine significantly improve. Patient stopped that medication and since that time patient has had significant reduction in migraine headaches. Nowadays she only has one severe migraine per year. Over past couple of years patient has had a new type of symptom/headache consisting of intermittent, brief sharp shooting "pinch" pain in the right or left parietal occipital region in the scalp. Symptoms occur without warning. No specific triggers. When the pain hits, she grabs her head and weeks for to go away. Sometimes she has closed eyes because the pain is so severe. No nausea vomiting, photophobia or phonophobia with these new symptoms. 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 Patient is having these twice per week. 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 Patient has some neck pain.   REVIEW OF SYSTEMS: Full 14 system review of systems performed and negative except: ringing in ears runny nose headaches.    ALLERGIES: Allergies  Allergen  Reactions  . Effexor [Venlafaxine] Other (See Comments)    Pt states it makes her crazy of she misses a dose  . Lexapro [Escitalopram Oxalate] Other (See Comments)    Abdominal pain    HOME MEDICATIONS: Outpatient Medications Prior to Visit  Medication Sig Dispense Refill  . fluticasone (FLONASE) 50 MCG/ACT nasal spray INSTILL 2 SPRAYS IN EACH NOSTRIL ONCE A DAY  12  . ibuprofen (ADVIL,MOTRIN) 600 MG tablet Take 1 tablet (600 mg total) by mouth every 6 (six) hours. 30 tablet 2  . omeprazole (PRILOSEC) 40 MG capsule Take 40 mg by mouth daily.  12  . rizatriptan (MAXALT-MLT) 10 MG disintegrating tablet Take 1 tablet (10 mg total) by mouth as needed for migraine. May repeat in 2 hours if needed 9 tablet 11  . Vitamin D, Cholecalciferol, 1000 units CAPS Take 5,000 mg by mouth.    . escitalopram (LEXAPRO) 10 MG tablet Take 1 tablet (10 mg total) by mouth daily. 90 tablet 3  . Loratadine (CLARITIN) 10 MG CAPS Take by mouth.     No facility-administered medications prior to visit.     PAST MEDICAL HISTORY: Past Medical History:  Diagnosis Date  . Abnormal Pap smear of cervix   . Acid reflux   . Fibrocystic breast   . Fibrocystic breast changes   . Migraine   . Syncope   . Ulcer of intestine   . Vitamin D deficiency   . Vitamin D deficiency     PAST SURGICAL HISTORY: Past Surgical History:  Procedure Laterality Date  . BREAST SURGERY     breast augmentation  . ESOPHAGOGASTRODUODENOSCOPY    . WISDOM TOOTH EXTRACTION      FAMILY HISTORY: Family History  Problem Relation Age of Onset  . Stroke Father   . Heart disease Father   . Hypertension Father   . Hypertension Mother   . Heart disease Brother   . Cancer Maternal Aunt   . Cancer Maternal Uncle     SOCIAL HISTORY:  Social History   Social History  . Marital status: Single    Spouse name: N/A  . Number of children: 2  . Years of education: college   Occupational History  .  [employer]   Social History Main  Topics  . Smoking status: Never Smoker  . Smokeless tobacco: Never Used  . Alcohol use 0.0 oz/week     Comment: social  . Drug use: No  . Sexual activity: Not on file   Other Topics Concern  . Not on file   Social History Narrative   Patient is single with 2 children.   Patient is right handed.   Patient has college education.   Patient drinks 1 cup daily.           PHYSICAL EXAM  Vitals:   04/14/16 1037  BP: (!) 136/91  Pulse: 73  Weight: 143 lb (64.9 kg)    Not recorded     Wt Readings from Last 3 Encounters:  04/14/16 143 lb (64.9 kg)  12/24/15 143 lb 3.2 oz (65 kg)  04/30/15 154 lb (69.9 kg)   Body mass index is 27.93 kg/m.  GENERAL EXAM: Patient is in no distress; well developed, nourished and groomed; neck is supple  CARDIOVASCULAR: Regular  rate and rhythm, no murmurs, no carotid bruits  NEUROLOGIC: MENTAL STATUS: awake, alert, language fluent, comprehension intact, naming intact, fund of knowledge appropriate CRANIAL NERVE: pupils equal and reactive to light, visual fields full to confrontation, extraocular muscles intact, no nystagmus, facial sensation and strength symmetric, hearing intact, palate elevates symmetrically, uvula midline, shoulder shrug symmetric, tongue midline. MOTOR: normal bulk and tone, full strength in the BUE, BLE SENSORY: normal and symmetric to light touch COORDINATION: finger-nose-finger, fine finger movements normal REFLEXES: deep tendon reflexes present and symmetric GAIT/STATION: narrow based gait; romberg is negative    DIAGNOSTIC DATA (LABS, IMAGING, TESTING) - I reviewed patient records, labs, notes, testing and imaging myself where available.  Lab Results  Component Value Date   WBC 13.6 (H) 05/01/2015   HGB 11.7 (L) 05/01/2015   HCT 35.5 (L) 05/01/2015   MCV 95.2 05/01/2015   PLT 188 05/01/2015      Component Value Date/Time   NA 141 08/15/2011 1652   K 3.7 08/15/2011 1652   CL 107 08/15/2011 1652   CO2  25 08/15/2011 1638   GLUCOSE 93 08/15/2011 1652   BUN 6 08/15/2011 1652   CREATININE 0.70 08/15/2011 1652   CALCIUM 8.3 (L) 08/15/2011 1638   PROT 7.5 08/25/2009 1510   ALBUMIN 4.1 08/25/2009 1510   AST 20 08/25/2009 1510   ALT 16 08/25/2009 1510   ALKPHOS 45 08/25/2009 1510   BILITOT 0.7 08/25/2009 1510   GFRNONAA >90 08/15/2011 1638   GFRAA >90 08/15/2011 1638   No results found for: CHOL No results found for: HGBA1C No results found for: VITAMINB12 No results found for: TSH   02/29/12 MRI BRAIN 1. Partially empty sella configuration. This can be a normal anatomic variant, but also can be a sign of idiopathic intracranial hypertension (pseudotumor cerebral). CSF opening pressure would evaluate further.  2. Mildly advanced for age nonspecific small signal change in the cerebral white matter. One area in the left parietal lobe sensory strip which could represent chronic micro hemorrhage, or less likely a small cavernous vascular malformation. Differential considerations include accelerated/hereditary small vessel ischemia, sequelae of trauma, hypercoagulable state, vasculitis, migraines, prior infection or less likely demyelination.  3. 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  05/08/14 MRI brain  1. Few (4-5) bifrontal juxtacortical and subcortical foci of non-specific gliosis. No abnormal lesions are seen on post contrast views.  2. Small punctate gradient echo hypointensity in the left parietal lobe, may represent chronic cerebral microhemorrhage.  3. No change from MRI on 02/28/12.     ASSESSMENT AND PLAN  40 y.o. 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 Patient did not benefit from trigger point injection 2 years ago. Doing well on conservative treatment.    Dx:  Migraine without aura and without status migrainosus, not intractable     PLAN: I spent 15 minutes of face to face time with patient. Greater than 50% of time was spent in counseling and coordination of care with patient. In summary we discussed:  - continue rizatriptan as needed - continue holistic treatment for headaches  Meds ordered this encounter  Medications  . rizatriptan (MAXALT-MLT) 10 MG disintegrating tablet    Sig: Take 1 tablet (10 mg total) by mouth as needed for migraine. May repeat in 2 hours if needed    Dispense:  9 tablet    Refill:  11   Return  in about 6 months (around 10/13/2016).    Suanne MarkerVIKRAM R. PENUMALLI, MD 04/14/2016, 11:11 AM Certified in Neurology, Neurophysiology and Neuroimaging  [facility] [address], [address] [place], [place] [address] [phone]

## 2016-05-06 ENCOUNTER — Encounter (HOSPITAL_COMMUNITY): Payer: Self-pay | Admitting: Radiology

## 2016-05-06 ENCOUNTER — Encounter (HOSPITAL_COMMUNITY)
Admission: RE | Admit: 2016-05-06 | Discharge: 2016-05-06 | Disposition: A | Discharge status: Home / Self Care | Payer: BLUE CROSS/BLUE SHIELD | Source: Ambulatory Visit | Attending: Gastroenterology | Admitting: Gastroenterology

## 2016-05-06 ENCOUNTER — Ambulatory Visit (HOSPITAL_COMMUNITY)
Admission: RE | Admit: 2016-05-06 | Discharge: 2016-05-06 | Disposition: A | Discharge status: Home / Self Care | Payer: BLUE CROSS/BLUE SHIELD | Source: Ambulatory Visit | Attending: Gastroenterology | Admitting: Gastroenterology

## 2016-05-06 DIAGNOSIS — R1013 Epigastric pain: Secondary | ICD-10-CM | POA: Insufficient documentation

## 2016-05-06 DIAGNOSIS — R109 Unspecified abdominal pain: Secondary | ICD-10-CM | POA: Diagnosis not present

## 2016-05-06 MED ORDER — TECHNETIUM TC 99M MEBROFENIN IV KIT
5.5000 | PACK | Freq: Once | INTRAVENOUS | Status: AC | PRN
  Administered 2016-05-06: 5.5 via INTRAVENOUS

## 2016-07-28 DIAGNOSIS — M654 Radial styloid tenosynovitis [de Quervain]: Secondary | ICD-10-CM | POA: Diagnosis not present

## 2016-07-28 DIAGNOSIS — M65832 Other synovitis and tenosynovitis, left forearm: Secondary | ICD-10-CM | POA: Diagnosis not present

## 2016-08-04 DIAGNOSIS — M25532 Pain in left wrist: Secondary | ICD-10-CM | POA: Diagnosis not present

## 2016-08-10 DIAGNOSIS — Z30432 Encounter for removal of intrauterine contraceptive device: Secondary | ICD-10-CM | POA: Diagnosis not present

## 2016-08-16 DIAGNOSIS — M654 Radial styloid tenosynovitis [de Quervain]: Secondary | ICD-10-CM | POA: Diagnosis not present

## 2016-08-23 DIAGNOSIS — M654 Radial styloid tenosynovitis [de Quervain]: Secondary | ICD-10-CM | POA: Diagnosis not present

## 2016-08-25 DIAGNOSIS — Z3200 Encounter for pregnancy test, result unknown: Secondary | ICD-10-CM | POA: Diagnosis not present

## 2016-08-25 DIAGNOSIS — R300 Dysuria: Secondary | ICD-10-CM | POA: Diagnosis not present

## 2016-08-25 DIAGNOSIS — N3001 Acute cystitis with hematuria: Secondary | ICD-10-CM | POA: Diagnosis not present

## 2016-08-25 DIAGNOSIS — K219 Gastro-esophageal reflux disease without esophagitis: Secondary | ICD-10-CM | POA: Diagnosis not present

## 2016-10-13 ENCOUNTER — Ambulatory Visit: Payer: BLUE CROSS/BLUE SHIELD | Admitting: Diagnostic Neuroimaging

## 2017-02-24 DIAGNOSIS — Z23 Encounter for immunization: Secondary | ICD-10-CM | POA: Diagnosis not present

## 2017-03-23 DIAGNOSIS — R109 Unspecified abdominal pain: Secondary | ICD-10-CM | POA: Diagnosis not present

## 2017-03-23 DIAGNOSIS — R350 Frequency of micturition: Secondary | ICD-10-CM | POA: Diagnosis not present

## 2017-04-05 DIAGNOSIS — Z6824 Body mass index (BMI) 24.0-24.9, adult: Secondary | ICD-10-CM | POA: Diagnosis not present

## 2017-04-05 DIAGNOSIS — Z01419 Encounter for gynecological examination (general) (routine) without abnormal findings: Secondary | ICD-10-CM | POA: Diagnosis not present

## 2017-04-05 DIAGNOSIS — Z1231 Encounter for screening mammogram for malignant neoplasm of breast: Secondary | ICD-10-CM | POA: Diagnosis not present

## 2018-02-21 DIAGNOSIS — Z23 Encounter for immunization: Secondary | ICD-10-CM | POA: Diagnosis not present

## 2018-03-14 DIAGNOSIS — Z0000 Encounter for general adult medical examination without abnormal findings: Secondary | ICD-10-CM | POA: Diagnosis not present

## 2018-03-14 DIAGNOSIS — E559 Vitamin D deficiency, unspecified: Secondary | ICD-10-CM | POA: Diagnosis not present

## 2018-03-14 DIAGNOSIS — E785 Hyperlipidemia, unspecified: Secondary | ICD-10-CM | POA: Diagnosis not present

## 2018-04-06 DIAGNOSIS — Z01419 Encounter for gynecological examination (general) (routine) without abnormal findings: Secondary | ICD-10-CM | POA: Diagnosis not present

## 2018-04-06 DIAGNOSIS — Z1231 Encounter for screening mammogram for malignant neoplasm of breast: Secondary | ICD-10-CM | POA: Diagnosis not present

## 2018-04-06 DIAGNOSIS — Z6825 Body mass index (BMI) 25.0-25.9, adult: Secondary | ICD-10-CM | POA: Diagnosis not present

## 2018-11-10 ENCOUNTER — Inpatient Hospital Stay (HOSPITAL_COMMUNITY)
Admission: AD | Admit: 2018-11-10 | Discharge: 2018-11-10 | Disposition: A | Discharge status: Home / Self Care | Payer: BC Managed Care – PPO | Attending: Obstetrics and Gynecology | Admitting: Obstetrics and Gynecology

## 2018-11-10 ENCOUNTER — Encounter (HOSPITAL_COMMUNITY): Payer: Self-pay | Admitting: *Deleted

## 2018-11-10 ENCOUNTER — Inpatient Hospital Stay (HOSPITAL_COMMUNITY): Payer: BC Managed Care – PPO

## 2018-11-10 ENCOUNTER — Other Ambulatory Visit: Payer: Self-pay

## 2018-11-10 DIAGNOSIS — Z3A10 10 weeks gestation of pregnancy: Secondary | ICD-10-CM | POA: Insufficient documentation

## 2018-11-10 DIAGNOSIS — Z3A01 Less than 8 weeks gestation of pregnancy: Secondary | ICD-10-CM

## 2018-11-10 DIAGNOSIS — O209 Hemorrhage in early pregnancy, unspecified: Secondary | ICD-10-CM | POA: Diagnosis not present

## 2018-11-10 DIAGNOSIS — O021 Missed abortion: Secondary | ICD-10-CM | POA: Insufficient documentation

## 2018-11-10 LAB — URINALYSIS, ROUTINE W REFLEX MICROSCOPIC
Bilirubin Urine: NEGATIVE
Glucose, UA: NEGATIVE mg/dL
Hgb urine dipstick: NEGATIVE
Ketones, ur: NEGATIVE mg/dL
Leukocytes,Ua: NEGATIVE
Nitrite: NEGATIVE
Protein, ur: NEGATIVE mg/dL
Specific Gravity, Urine: 1.014 (ref 1.005–1.030)
pH: 6 (ref 5.0–8.0)

## 2018-11-10 LAB — CBC
HCT: 36.5 % (ref 36.0–46.0)
Hemoglobin: 12.2 g/dL (ref 12.0–15.0)
MCH: 31.8 pg (ref 26.0–34.0)
MCHC: 33.4 g/dL (ref 30.0–36.0)
MCV: 95.1 fL (ref 80.0–100.0)
Platelets: 210 10*3/uL (ref 150–400)
RBC: 3.84 MIL/uL — ABNORMAL LOW (ref 3.87–5.11)
RDW: 12.7 % (ref 11.5–15.5)
WBC: 7.4 10*3/uL (ref 4.0–10.5)
nRBC: 0 % (ref 0.0–0.2)

## 2018-11-10 LAB — WET PREP, GENITAL
Clue Cells Wet Prep HPF POC: NONE SEEN
Sperm: NONE SEEN
Trich, Wet Prep: NONE SEEN
Yeast Wet Prep HPF POC: NONE SEEN

## 2018-11-10 LAB — POCT PREGNANCY, URINE: Preg Test, Ur: POSITIVE — AB

## 2018-11-10 LAB — HCG, QUANTITATIVE, PREGNANCY: hCG, Beta Chain, Quant, S: 1429 m[IU]/mL — ABNORMAL HIGH (ref <5)

## 2018-11-10 NOTE — Discharge Instructions (Signed)
Miscarriage °A miscarriage is the loss of an unborn baby (fetus) before the 20th week of pregnancy. Most miscarriages happen during the first 3 months of pregnancy. Sometimes, a miscarriage can happen before a woman knows that she is pregnant. °Having a miscarriage can be an emotional experience. If you have had a miscarriage, talk with your health care provider about any questions you may have about miscarrying, the grieving process, and your plans for future pregnancy. °What are the causes? °A miscarriage may be caused by: °· Problems with the genes or chromosomes of the fetus. These problems make it impossible for the baby to develop normally. They are often the result of random errors that occur early in the development of the baby, and are not passed from parent to child (not inherited). °· Infection of the cervix or uterus. °· Conditions that affect hormone balance in the body. °· Problems with the cervix, such as the cervix opening and thinning before pregnancy is at term (cervical insufficiency). °· Problems with the uterus. These may include: °? A uterus with an abnormal shape. °? Fibroids in the uterus. °? Congenital abnormalities. These are problems that were present at birth. °· Certain medical conditions. °· Smoking, drinking alcohol, or using drugs. °· Injury (trauma). °In many cases, the cause of a miscarriage is not known. °What are the signs or symptoms? °Symptoms of this condition include: °· Vaginal bleeding or spotting, with or without cramps or pain. °· Pain or cramping in the abdomen or lower back. °· Passing fluid, tissue, or blood clots from the vagina. °How is this diagnosed? °This condition may be diagnosed based on: °· A physical exam. °· Ultrasound. °· Blood tests. °· Urine tests. °How is this treated? °Treatment for a miscarriage is sometimes not necessary if you naturally pass all the tissue that was in your uterus. If necessary, this condition may be treated with: °· Dilation and  curettage (D&C). This is a procedure in which the cervix is stretched open and the lining of the uterus (endometrium) is scraped. This is done only if tissue from the fetus or placenta remains in the body (incomplete miscarriage). °· Medicines, such as: °? Antibiotic medicine, to treat infection. °? Medicine to help the body pass any remaining tissue. °? Medicine to reduce (contract) the size of the uterus. These medicines may be given if you have a lot of bleeding. °If you have Rh negative blood and your baby was Rh positive, you will need a shot of a medicine called Rh immunoglobulinto protect your future babies from Rh blood problems. "Rh-negative" and "Rh-positive" refer to whether or not the blood has a specific protein found on the surface of red blood cells (Rh factor). °Follow these instructions at home: °Medicines ° °· Take over-the-counter and prescription medicines only as told by your health care provider. °· If you were prescribed antibiotic medicine, take it as told by your health care provider. Do not stop taking the antibiotic even if you start to feel better. °· Do not take NSAIDs, such as aspirin and ibuprofen, unless they are approved by your health care provider. These medicines can cause bleeding. °Activity °· Rest as directed. Ask your health care provider what activities are safe for you. °· Have someone help with home and family responsibilities during this time. °General instructions °· Keep track of the number of sanitary pads you use each day and how soaked (saturated) they are. Write down this information. °· Monitor the amount of tissue or blood clots that   you pass from your vagina. Save any large amounts of tissue for your health care provider to examine. °· Do not use tampons, douche, or have sex until your health care provider approves. °· To help you and your partner with the process of grieving, talk with your health care provider or seek counseling. °· When you are ready, meet with  your health care provider to discuss any important steps you should take for your health. Also, discuss steps you should take to have a healthy pregnancy in the future. °· Keep all follow-up visits as told by your health care provider. This is important. °Where to find more information °· The American Congress of Obstetricians and Gynecologists: www.acog.org °· [place]. Department of Health and Human Services Office of Women’s Health: www.womenshealth.gov °Contact a health care provider if: °· You have a fever or chills. °· You have a foul smelling vaginal discharge. °· You have more bleeding instead of less. °Get help right away if: °· You have severe cramps or pain in your back or abdomen. °· You pass blood clots or tissue from your vagina that is walnut-sized or larger. °· You soak more than 1 regular sanitary pad in an hour. °· You become light-headed or weak. °· You pass out. °· You have feelings of sadness that take over your thoughts, or you have thoughts of hurting yourself. °Summary °· Most miscarriages happen in the first 3 months of pregnancy. Sometimes miscarriage happens before a woman even knows that she is pregnant. °· Follow your health care provider's instruction for home care. Keep all follow-up appointments. °· To help you and your partner with the process of grieving, talk with your health care provider or seek counseling. °This information is not intended to replace advice given to you by your health care provider. Make sure you discuss any questions you have with your health care provider. °Document Released: 10/13/2000 Document Revised: 08/11/2018 Document Reviewed: 05/25/2016 °Elsevier Patient Education © 2020 [employer] Inc. ° ° °Managing Pregnancy Loss °Pregnancy loss can happen any time during a pregnancy. Often the cause is not known. It is rarely because of anything you did. Pregnancy loss in early pregnancy (during the first trimester) is called a miscarriage. This type of pregnancy loss is  the most common. Pregnancy loss that happens after 20 weeks of pregnancy is called fetal demise if the baby's heart stops beating before birth. Fetal demise is much less common. Some women experience spontaneous labor shortly after fetal demise resulting in a stillborn birth (stillbirth). °Any pregnancy loss can be devastating. You will need to recover both physically and emotionally. Most women are able to get pregnant again after a pregnancy loss and deliver a healthy baby. °How to manage emotional recovery ° °Pregnancy loss is very hard emotionally. You may feel many different emotions while you grieve. You may feel sad and angry. You may also feel guilty. It is normal to have periods of crying. Emotional recovery can take longer than physical recovery. It is different for everyone. °Taking these steps can help you in managing this loss: °· Remember that it is unlikely you did anything to cause the pregnancy loss. °· Share your thoughts and feelings with friends, family, and your partner. Remember that your partner is also recovering emotionally. °· Make sure you have a good support system. Do not spend too much time alone. °· Meet with a pregnancy loss counselor or join a pregnancy loss support group. °· Get enough sleep and eat a healthy diet. Return   to regular exercise when you have recovered physically. °· Do not use drugs or alcohol to manage your emotions. °· Consider seeing a mental health professional to help you recover emotionally. °· Ask a friend or loved one to help you decide what to do with any clothing and nursery items you received for your baby. °In the case of a stillbirth, many women benefit from taking additional steps in the grieving process. You may want to: °· Hold your baby after the birth. °· Name your baby. °· Request a birth certificate. °· Create a keepsake such as handprints or footprints. °· Dress your baby and have a picture taken. °· Make funeral arrangements. °· Ask for a baptism  or blessing. °Hospitals have staff members who can help you with all these arrangements. °How to recognize emotional stress °It is normal to have emotional stress after a pregnancy loss. But emotional stress that lasts a long time or becomes severe requires treatment. Watch out for these signs of severe emotional stress: °· Sadness, anger, or guilt that is not going away and is interfering with your normal activities. °· Relationship problems that have occurred or gotten worse since the pregnancy loss. °· Signs of depression that last longer than 2 weeks. These may include: °? Sadness. °? Anxiety. °? Hopelessness. °? Loss of interest in activities you enjoy. °? Inability to concentrate. °? Trouble sleeping or sleeping too much. °? Loss of appetite or overeating. °? Thoughts of death or of hurting yourself. °Follow these instructions at home: °· Take over-the-counter and prescription medicines only as told by your health care provider. °· Rest at home until your energy level returns. Return to your normal activities as told by your health care provider. Ask your health care provider what activities are safe for you. °· When you are ready, meet with your health care provider to discuss steps to take for a future pregnancy. °· Keep all follow-up visits as told by your health care provider. This is important. °Where to find support °· To help you and your partner with the process of grieving, talk with your health care provider or seek counseling. °· Consider meeting with others who have experienced pregnancy loss. Ask your health care provider about support groups and resources. °Where to find more information °· U.S. Department of Health and Human Services Office on Women's Health: www.womenshealth.gov °· American Pregnancy Association: www.americanpregnancy.org °Contact a health care provider if: °· You continue to experience grief, sadness, or lack of motivation for everyday activities, and those feelings do not  improve over time. °· You are struggling to recover emotionally, especially if you are using alcohol or substances to help. °Get help right away if: °· You have thoughts of hurting yourself or others. °If you ever feel like you may hurt yourself or others, or have thoughts about taking your own life, get help right away. You can go to your nearest emergency department or call: °· Your local emergency services (911 in the [place] °· A suicide crisis helpline, such as the National Suicide Prevention Lifeline at 1-800-273-8255. This is open 24 hours a day. °Summary °· Any pregnancy loss can be difficult physically and emotionally. °· You may experience many different emotions while you grieve. Emotional recovery can last longer than physical recovery. °· It is normal to have emotional stress after a pregnancy loss. But emotional stress that lasts a long time or becomes severe requires treatment. °· See your health care provider if you are struggling emotionally after a pregnancy   loss. °This information is not intended to replace advice given to you by your health care provider. Make sure you discuss any questions you have with your health care provider. °Document Released: 06/30/2017 Document Revised: 08/09/2018 Document Reviewed: 06/30/2017 °Elsevier Patient Education © 2020 Elsevier Inc. ° °

## 2018-11-10 NOTE — MAU Provider Note (Signed)
Chief Complaint: Vaginal Bleeding and Possible Pregnancy   First Provider Initiated Contact with Patient 11/10/18 1606     SUBJECTIVE HPI: Carolyn Mccall is a 43 y.o. [id number] at 729w4d who presents to [facility] reporting vaginal bleeding. Reports red/brown spotting on toilet paper & in toilet since last night. Is not saturating pads or passing clots. Denies abdominal pain. Is supposed to go to [facility] for first ob appointment on Tuesday but hasn't been seen yet with this pregnancy. Last had intercourse 2 days ago.    Past Medical History:  Diagnosis Date  . Abnormal Pap smear of cervix   . Acid reflux   . Fibrocystic breast   . Migraine   . Syncope   . Ulcer of intestine   . Vitamin D deficiency    OB History  Gravida Para Term Preterm AB Living  5 4 4     4   SAB TAB Ectopic Multiple Live Births        0 4    # Outcome Date GA Lbr Len/2nd Weight Sex Delivery Anes PTL Lv  5 Current           4 Term 05/01/15 3782w1d 00:29 / 00:09 3235 g F Vag-Spont Local  LIV     Birth Comments: [id number] Hugs 734  3 Term 07/19/00 3053w0d   M Vag-Spont   LIV  2 Term 11/09/96 6753w0d   F Vag-Spont   LIV  1 Term 12/22/92 1953w0d   F Vag-Spont   LIV   Past Surgical History:  Procedure Laterality Date  . BREAST SURGERY     breast augmentation  . ESOPHAGOGASTRODUODENOSCOPY    . WISDOM TOOTH EXTRACTION     Social History   Socioeconomic History  . Marital status: Single    Spouse name: Not on file  . Number of children: 2  . Years of education: college  . Highest education level: Not on file  Occupational History    Employer: [employer]  Social Needs  . Financial resource strain: Not on file  . Food insecurity    Worry: Not on file    Inability: Not on file  . Transportation needs    Medical: Not on file    Non-medical: Not on file  Tobacco Use  . Smoking status: Never Smoker  . Smokeless tobacco: Never Used  Substance and Sexual Activity  . Alcohol use: Yes    Alcohol/week: 0.0  standard drinks    Comment: social  . Drug use: No  . Sexual activity: Not on file  Lifestyle  . Physical activity    Days per week: Not on file    Minutes per session: Not on file  . Stress: Not on file  Relationships  . Social Musicianconnections    Talks on phone: Not on file    Gets together: Not on file    Attends religious service: Not on file    Active member of club or organization: Not on file    Attends meetings of clubs or organizations: Not on file    Relationship status: Not on file  . Intimate partner violence    Fear of current or ex partner: Not on file    Emotionally abused: Not on file    Physically abused: Not on file    Forced sexual activity: Not on file  Other Topics Concern  . Not on file  Social History Narrative   Patient is single with 2 children.   Patient is right handed.  Patient has college education.   Patient drinks 1 cup daily.      Family History  Problem Relation Age of Onset  . Stroke Father   . Heart disease Father   . Hypertension Father   . Hypertension Mother   . Heart disease Brother   . Cancer Maternal Aunt   . Cancer Maternal Uncle    No current facility-administered medications on file prior to encounter.    Current Outpatient Medications on File Prior to Encounter  Medication Sig Dispense Refill  . cetirizine (ZYRTEC) 10 MG tablet Take 10 mg by mouth daily.    . Prenatal Vit-Fe Fumarate-FA (PRENATAL MULTIVITAMIN) TABS tablet Take 1 tablet by mouth daily at 12 noon.     Allergies  Allergen Reactions  . Effexor [Venlafaxine] Other (See Comments)    Pt states it makes her crazy of she misses a dose  . Lexapro [Escitalopram Oxalate] Other (See Comments)    Abdominal pain    I have reviewed patient's Past Medical Hx, Surgical Hx, Family Hx, Social Hx, medications and allergies.   Review of Systems  Constitutional: Negative.   Gastrointestinal: Negative.   Genitourinary: Positive for vaginal bleeding.     OBJECTIVE Patient Vitals for the past 24 hrs:  BP Temp Temp src Pulse Resp SpO2 Height Weight  11/10/18 1920 (!) 144/70 98.1 F (36.7 C) Oral 61 19 - - -  11/10/18 1402 132/75 98.4 F (36.9 C) Oral (!) 56 16 98 % 5' (1.524 m) 63.4 kg   Constitutional: Well-developed, well-nourished female in no acute distress.  Cardiovascular: normal rate & rhythm, no murmur Respiratory: normal rate and effort. Lung sounds clear throughout GI: Abd soft, non-tender, Pos BS x 4. No guarding or rebound tenderness MS: Extremities nontender, no edema, normal ROM Neurologic: Alert and oriented x 4.  GU:   Small amount of brown blood mixed with red mucoid blood. No active bleeding from os. Cervix closed.     LAB RESULTS Results for orders placed or performed during the hospital encounter of 11/10/18 (from the past 24 hour(s))  Pregnancy, urine POC     Status: Abnormal   Collection Time: 11/10/18  2:56 PM  Result Value Ref Range   Preg Test, Ur POSITIVE (A) NEGATIVE  Urinalysis, Routine w reflex microscopic     Status: None   Collection Time: 11/10/18  3:07 PM  Result Value Ref Range   Color, Urine YELLOW YELLOW   APPearance CLEAR CLEAR   Specific Gravity, Urine 1.014 1.005 - 1.030   pH 6.0 5.0 - 8.0   Glucose, UA NEGATIVE NEGATIVE mg/dL   Hgb urine dipstick NEGATIVE NEGATIVE   Bilirubin Urine NEGATIVE NEGATIVE   Ketones, ur NEGATIVE NEGATIVE mg/dL   Protein, ur NEGATIVE NEGATIVE mg/dL   Nitrite NEGATIVE NEGATIVE   Leukocytes,Ua NEGATIVE NEGATIVE  Wet prep, genital     Status: Abnormal   Collection Time: 11/10/18  4:15 PM   Specimen: Cervix  Result Value Ref Range   Yeast Wet Prep HPF POC NONE SEEN NONE SEEN   Trich, Wet Prep NONE SEEN NONE SEEN   Clue Cells Wet Prep HPF POC NONE SEEN NONE SEEN   WBC, Wet Prep HPF POC MODERATE (A) NONE SEEN   Sperm NONE SEEN   CBC     Status: Abnormal   Collection Time: 11/10/18  4:21 PM  Result Value Ref Range   WBC 7.4 4.0 - 10.5 K/uL   RBC 3.84 (L)  3.87 - 5.11 MIL/uL  Hemoglobin 12.2 12.0 - 15.0 g/dL   HCT 36.5 36.0 - 46.0 %   MCV 95.1 80.0 - 100.0 fL   MCH 31.8 26.0 - 34.0 pg   MCHC 33.4 30.0 - 36.0 g/dL   RDW 12.7 11.5 - 15.5 %   Platelets 210 150 - 400 K/uL   nRBC 0.0 0.0 - 0.2 %  hCG, quantitative, pregnancy     Status: Abnormal   Collection Time: 11/10/18  4:21 PM  Result Value Ref Range   hCG, Beta Chain, Quant, S 1,429 (H) <5 mIU/mL    IMAGING US Ob Comp Less 14 Wks  Result Date: 11/10/2018 CLINICAL DATA:  Bleeding EXAM: OBSTETRIC <14 WK [place] AND TRANSVAGINAL OB US TECHNIQUE: Both transabdominal and transvaginal ultrasound examinations were performed for complete evaluation of the gestation as well as the maternal uterus, adnexal regions, and pelvic cul-de-sac. Transvaginal technique was performed to assess early pregnancy. COMPARISON:  None. FINDINGS: Intrauterine gestational sac: Single Yolk sac:  Not Visualized. Embryo:  Visualized. Cardiac Activity: Not Visualized. CRL:  15.7 mm   7 w   6 d                  [place] EDC: 06/23/2019 Subchorionic hemorrhage:  None visualized. Maternal uterus/adnexae: The ovaries are unremarkable IMPRESSION: Single IUP at 7 weeks and 6 days. The crown-rump length is 15.7. No cardiac activity was visualized. Findings meet definitive criteria for failed pregnancy. This follows SRU consensus guidelines: Diagnostic Criteria for Nonviable Pregnancy Early in the First Trimester. Alison Stalling J Med [phone]. Electronically Signed   By: Constance Holster M.D.   On: 11/10/2018 17:25    MAU COURSE Orders Placed This Encounter  Procedures  . Wet prep, genital  . US OB Comp Less 14 Wks  . Urinalysis, Routine w reflex microscopic  . CBC  . hCG, quantitative, pregnancy  . Pregnancy, urine POC  . Discharge patient   No orders of the defined types were placed in this encounter.   MDM +UPT UA, wet prep, GC/chlamydia, CBC, ABO/Rh, quant hCG, and [place] today to rule out ectopic pregnancy  RH  positive  Ultrasound shows IUP measuring [redacted]w[redacted]d with no cardiac activity. Diagnostic of failed pregnancy.  Discussed results with patient who is appropriately upset. She would like to f/u with her ob & not make any decisions at this time.   ASSESSMENT 1. Missed abortion   2. Vaginal bleeding in pregnancy, first trimester   3. [redacted] weeks gestation of pregnancy     PLAN Discharge home in stable condition. Discussed reasons to return to MAU [facility] CNM notified of patient & need for SAB follow up appt  [facility] Follow up.   Specialty: Obstetrics and Gynecology Why: keep scheduled appointment on Tuesday Contact information: [address] [place] [address] [phone]       [facility] Follow up.   Specialty: Obstetrics and Gynecology Why: return for worsening symptoms Contact information: [address] [id number] [place] [phone]         Allergies as of 11/10/2018      Reactions   Effexor [venlafaxine] Other (See Comments)   Pt states it makes her crazy of she misses a dose   Lexapro [escitalopram Oxalate] Other (See Comments)   Abdominal pain      Medication List    STOP taking these medications   fluticasone 50 MCG/ACT nasal spray Commonly  known as: FLONASE   ibuprofen 600 MG tablet Commonly known as: ADVIL   omeprazole 40 MG capsule Commonly known as: PRILOSEC   rizatriptan 10 MG disintegrating tablet Commonly known as: MAXALT-MLT   Vitamin D (Cholecalciferol) 25 MCG (1000 UT) Caps     TAKE these medications   cetirizine 10 MG tablet Commonly known as: ZYRTEC Take 10 mg by mouth daily.   prenatal multivitamin Tabs tablet Take 1 tablet by mouth daily at 12 noon.        Judeth HornLawrence, Kamiyah Kindel, NP 11/10/2018  7:45 PM

## 2018-11-10 NOTE — MAU Note (Signed)
Hasn't been to the dr yet.  +HPT beginning of June.  Spotting yesterday and today, heavier today.  "feels a little funny lower, not painful"

## 2018-11-14 DIAGNOSIS — R102 Pelvic and perineal pain: Secondary | ICD-10-CM | POA: Diagnosis not present

## 2018-11-14 DIAGNOSIS — Z3A10 10 weeks gestation of pregnancy: Secondary | ICD-10-CM | POA: Diagnosis not present

## 2018-11-14 DIAGNOSIS — O039 Complete or unspecified spontaneous abortion without complication: Secondary | ICD-10-CM | POA: Diagnosis not present

## 2018-11-14 DIAGNOSIS — E039 Hypothyroidism, unspecified: Secondary | ICD-10-CM | POA: Diagnosis not present

## 2018-11-14 DIAGNOSIS — O3680X9 Pregnancy with inconclusive fetal viability, other fetus: Secondary | ICD-10-CM | POA: Diagnosis not present

## 2018-11-14 LAB — GC/CHLAMYDIA PROBE AMP (~~LOC~~) NOT AT ARMC
Chlamydia: NEGATIVE
Neisseria Gonorrhea: NEGATIVE

## 2018-11-20 DIAGNOSIS — M549 Dorsalgia, unspecified: Secondary | ICD-10-CM | POA: Diagnosis not present

## 2018-11-21 DIAGNOSIS — O039 Complete or unspecified spontaneous abortion without complication: Secondary | ICD-10-CM | POA: Diagnosis not present

## 2018-11-28 DIAGNOSIS — O039 Complete or unspecified spontaneous abortion without complication: Secondary | ICD-10-CM | POA: Diagnosis not present

## 2018-12-05 DIAGNOSIS — O039 Complete or unspecified spontaneous abortion without complication: Secondary | ICD-10-CM | POA: Diagnosis not present

## 2019-01-09 DIAGNOSIS — U071 COVID-19: Secondary | ICD-10-CM | POA: Diagnosis not present

## 2019-01-09 DIAGNOSIS — Z20828 Contact with and (suspected) exposure to other viral communicable diseases: Secondary | ICD-10-CM | POA: Diagnosis not present

## 2019-01-31 DIAGNOSIS — R946 Abnormal results of thyroid function studies: Secondary | ICD-10-CM | POA: Diagnosis not present

## 2019-01-31 DIAGNOSIS — Z23 Encounter for immunization: Secondary | ICD-10-CM | POA: Diagnosis not present

## 2019-02-20 DIAGNOSIS — N390 Urinary tract infection, site not specified: Secondary | ICD-10-CM | POA: Diagnosis not present

## 2019-03-02 DIAGNOSIS — K5900 Constipation, unspecified: Secondary | ICD-10-CM | POA: Diagnosis not present

## 2019-03-02 DIAGNOSIS — J309 Allergic rhinitis, unspecified: Secondary | ICD-10-CM | POA: Diagnosis not present

## 2019-03-02 DIAGNOSIS — M549 Dorsalgia, unspecified: Secondary | ICD-10-CM | POA: Diagnosis not present

## 2019-03-20 ENCOUNTER — Other Ambulatory Visit: Payer: Self-pay | Admitting: Family Medicine

## 2019-03-20 ENCOUNTER — Other Ambulatory Visit (HOSPITAL_COMMUNITY)
Admission: RE | Admit: 2019-03-20 | Discharge: 2019-03-20 | Disposition: A | Discharge status: Home / Self Care | Payer: BC Managed Care – PPO | Source: Ambulatory Visit | Attending: Family Medicine | Admitting: Family Medicine

## 2019-03-20 DIAGNOSIS — Z124 Encounter for screening for malignant neoplasm of cervix: Secondary | ICD-10-CM | POA: Diagnosis not present

## 2019-03-20 DIAGNOSIS — Z0000 Encounter for general adult medical examination without abnormal findings: Secondary | ICD-10-CM | POA: Diagnosis not present

## 2019-03-20 DIAGNOSIS — E039 Hypothyroidism, unspecified: Secondary | ICD-10-CM | POA: Diagnosis not present

## 2019-03-20 DIAGNOSIS — Z13220 Encounter for screening for lipoid disorders: Secondary | ICD-10-CM | POA: Diagnosis not present

## 2019-03-20 DIAGNOSIS — E559 Vitamin D deficiency, unspecified: Secondary | ICD-10-CM | POA: Diagnosis not present

## 2019-03-20 DIAGNOSIS — Z1159 Encounter for screening for other viral diseases: Secondary | ICD-10-CM | POA: Diagnosis not present

## 2019-03-23 LAB — CYTOLOGY - PAP
Comment: NEGATIVE
Diagnosis: NEGATIVE
High risk HPV: NEGATIVE

## 2019-03-26 ENCOUNTER — Other Ambulatory Visit: Payer: Self-pay | Admitting: Family Medicine

## 2019-03-26 DIAGNOSIS — Z1231 Encounter for screening mammogram for malignant neoplasm of breast: Secondary | ICD-10-CM

## 2019-04-16 DIAGNOSIS — J309 Allergic rhinitis, unspecified: Secondary | ICD-10-CM | POA: Diagnosis not present

## 2019-04-16 DIAGNOSIS — R430 Anosmia: Secondary | ICD-10-CM | POA: Diagnosis not present

## 2019-04-16 DIAGNOSIS — R432 Parageusia: Secondary | ICD-10-CM | POA: Diagnosis not present

## 2019-04-16 DIAGNOSIS — R519 Headache, unspecified: Secondary | ICD-10-CM | POA: Diagnosis not present

## 2019-04-17 DIAGNOSIS — M791 Myalgia, unspecified site: Secondary | ICD-10-CM | POA: Diagnosis not present

## 2019-04-17 DIAGNOSIS — R5383 Other fatigue: Secondary | ICD-10-CM | POA: Diagnosis not present

## 2019-04-17 DIAGNOSIS — Z20828 Contact with and (suspected) exposure to other viral communicable diseases: Secondary | ICD-10-CM | POA: Diagnosis not present

## 2019-04-17 DIAGNOSIS — R519 Headache, unspecified: Secondary | ICD-10-CM | POA: Diagnosis not present

## 2019-05-29 DIAGNOSIS — Z1231 Encounter for screening mammogram for malignant neoplasm of breast: Secondary | ICD-10-CM | POA: Diagnosis not present

## 2019-06-16 ENCOUNTER — Other Ambulatory Visit: Payer: Self-pay

## 2019-06-16 ENCOUNTER — Encounter (HOSPITAL_BASED_OUTPATIENT_CLINIC_OR_DEPARTMENT_OTHER): Payer: Self-pay | Admitting: Emergency Medicine

## 2019-06-16 ENCOUNTER — Emergency Department (HOSPITAL_BASED_OUTPATIENT_CLINIC_OR_DEPARTMENT_OTHER)
Admission: EM | Admit: 2019-06-16 | Discharge: 2019-06-16 | Disposition: A | Discharge status: Home / Self Care | Payer: BC Managed Care – PPO | Attending: Emergency Medicine | Admitting: Emergency Medicine

## 2019-06-16 DIAGNOSIS — F10920 Alcohol use, unspecified with intoxication, uncomplicated: Secondary | ICD-10-CM

## 2019-06-16 DIAGNOSIS — Z79899 Other long term (current) drug therapy: Secondary | ICD-10-CM | POA: Diagnosis not present

## 2019-06-16 DIAGNOSIS — F10929 Alcohol use, unspecified with intoxication, unspecified: Secondary | ICD-10-CM | POA: Diagnosis not present

## 2019-06-16 DIAGNOSIS — Y906 Blood alcohol level of 120-199 mg/100 ml: Secondary | ICD-10-CM | POA: Insufficient documentation

## 2019-06-16 DIAGNOSIS — I10 Essential (primary) hypertension: Secondary | ICD-10-CM | POA: Diagnosis not present

## 2019-06-16 DIAGNOSIS — R404 Transient alteration of awareness: Secondary | ICD-10-CM | POA: Diagnosis not present

## 2019-06-16 HISTORY — DX: Hypothyroidism, unspecified: E03.9

## 2019-06-16 LAB — RAPID URINE DRUG SCREEN, HOSP PERFORMED
Amphetamines: NOT DETECTED
Barbiturates: NOT DETECTED
Benzodiazepines: NOT DETECTED
Cocaine: NOT DETECTED
Opiates: NOT DETECTED
Tetrahydrocannabinol: NOT DETECTED

## 2019-06-16 LAB — ETHANOL: Alcohol, Ethyl (B): 174 mg/dL — ABNORMAL HIGH (ref <10)

## 2019-06-16 LAB — PREGNANCY, URINE: Preg Test, Ur: NEGATIVE

## 2019-06-16 MED ORDER — PANTOPRAZOLE SODIUM 40 MG IV SOLR
40.0000 mg | Freq: Once | INTRAVENOUS | Status: AC
  Administered 2019-06-16: 40 mg via INTRAVENOUS
  Filled 2019-06-16: qty 40

## 2019-06-16 MED ORDER — SODIUM CHLORIDE 0.9 % IV BOLUS
1000.0000 mL | Freq: Once | INTRAVENOUS | Status: AC
  Administered 2019-06-16: 1000 mL via INTRAVENOUS

## 2019-06-16 MED ORDER — ONDANSETRON HCL 4 MG/2ML IJ SOLN
4.0000 mg | Freq: Once | INTRAMUSCULAR | Status: AC
  Administered 2019-06-16: 4 mg via INTRAVENOUS
  Filled 2019-06-16: qty 2
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
## 2019-06-16 NOTE — ED Triage Notes (Signed)
  Patient BIB EMS from home with alcohol intoxication.  Family states patient has been drinking a lot of tequila and wine last night and started throwing up.  EMS states patient only threw up once.  Patient is lethargic and responsive only to painful stimulus.

## 2019-06-16 NOTE — ED Notes (Signed)
Pt daughter called and will be coming to pick up pt shortly.

## 2019-06-16 NOTE — ED Notes (Signed)
   Patient awake and answering questions at this time.  Patient tearful and asking for family.
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
## 2019-06-16 NOTE — ED Notes (Signed)
  Attempted to call patients husband and daughter but neither one picked up.  Will attempt to call again.

## 2019-10-17 DIAGNOSIS — E559 Vitamin D deficiency, unspecified: Secondary | ICD-10-CM | POA: Diagnosis not present

## 2019-10-17 DIAGNOSIS — M549 Dorsalgia, unspecified: Secondary | ICD-10-CM | POA: Diagnosis not present

## 2019-10-17 DIAGNOSIS — R319 Hematuria, unspecified: Secondary | ICD-10-CM | POA: Diagnosis not present

## 2019-11-16 DIAGNOSIS — R300 Dysuria: Secondary | ICD-10-CM | POA: Diagnosis not present

## 2020-01-17 DIAGNOSIS — E559 Vitamin D deficiency, unspecified: Secondary | ICD-10-CM | POA: Diagnosis not present

## 2020-01-17 DIAGNOSIS — R030 Elevated blood-pressure reading, without diagnosis of hypertension: Secondary | ICD-10-CM | POA: Diagnosis not present

## 2020-01-17 DIAGNOSIS — E039 Hypothyroidism, unspecified: Secondary | ICD-10-CM | POA: Diagnosis not present

## 2020-01-17 DIAGNOSIS — Z23 Encounter for immunization: Secondary | ICD-10-CM | POA: Diagnosis not present

## 2020-01-17 DIAGNOSIS — F4321 Adjustment disorder with depressed mood: Secondary | ICD-10-CM | POA: Diagnosis not present

## 2020-03-24 DIAGNOSIS — Z0000 Encounter for general adult medical examination without abnormal findings: Secondary | ICD-10-CM | POA: Diagnosis not present

## 2020-03-24 DIAGNOSIS — E785 Hyperlipidemia, unspecified: Secondary | ICD-10-CM | POA: Diagnosis not present

## 2020-03-24 DIAGNOSIS — E039 Hypothyroidism, unspecified: Secondary | ICD-10-CM | POA: Diagnosis not present

## 2020-03-24 DIAGNOSIS — E559 Vitamin D deficiency, unspecified: Secondary | ICD-10-CM | POA: Diagnosis not present

## 2020-04-17 DIAGNOSIS — K219 Gastro-esophageal reflux disease without esophagitis: Secondary | ICD-10-CM | POA: Diagnosis not present

## 2020-04-17 DIAGNOSIS — Z1211 Encounter for screening for malignant neoplasm of colon: Secondary | ICD-10-CM | POA: Diagnosis not present

## 2020-04-17 DIAGNOSIS — K5900 Constipation, unspecified: Secondary | ICD-10-CM | POA: Diagnosis not present

## 2020-05-05 DIAGNOSIS — J069 Acute upper respiratory infection, unspecified: Secondary | ICD-10-CM | POA: Diagnosis not present

## 2020-05-05 DIAGNOSIS — J018 Other acute sinusitis: Secondary | ICD-10-CM | POA: Diagnosis not present

## 2020-05-06 DIAGNOSIS — J069 Acute upper respiratory infection, unspecified: Secondary | ICD-10-CM | POA: Diagnosis not present

## 2020-05-06 DIAGNOSIS — Z20822 Contact with and (suspected) exposure to covid-19: Secondary | ICD-10-CM | POA: Diagnosis not present

## 2020-05-21 DIAGNOSIS — K635 Polyp of colon: Secondary | ICD-10-CM | POA: Diagnosis not present

## 2020-05-21 DIAGNOSIS — Z1211 Encounter for screening for malignant neoplasm of colon: Secondary | ICD-10-CM | POA: Diagnosis not present

## 2020-05-21 DIAGNOSIS — D125 Benign neoplasm of sigmoid colon: Secondary | ICD-10-CM | POA: Diagnosis not present

## 2020-06-10 DIAGNOSIS — Z8759 Personal history of other complications of pregnancy, childbirth and the puerperium: Secondary | ICD-10-CM | POA: Diagnosis not present

## 2020-06-10 DIAGNOSIS — N939 Abnormal uterine and vaginal bleeding, unspecified: Secondary | ICD-10-CM | POA: Diagnosis not present

## 2020-07-03 DIAGNOSIS — Z1231 Encounter for screening mammogram for malignant neoplasm of breast: Secondary | ICD-10-CM | POA: Diagnosis not present

## 2020-10-01 DIAGNOSIS — J209 Acute bronchitis, unspecified: Secondary | ICD-10-CM | POA: Diagnosis not present

## 2020-10-12 DIAGNOSIS — Z79899 Other long term (current) drug therapy: Secondary | ICD-10-CM | POA: Diagnosis not present

## 2020-10-12 DIAGNOSIS — J9811 Atelectasis: Secondary | ICD-10-CM | POA: Diagnosis not present

## 2020-10-12 DIAGNOSIS — R531 Weakness: Secondary | ICD-10-CM | POA: Diagnosis not present

## 2020-10-12 DIAGNOSIS — E039 Hypothyroidism, unspecified: Secondary | ICD-10-CM | POA: Diagnosis not present

## 2020-10-12 DIAGNOSIS — I10 Essential (primary) hypertension: Secondary | ICD-10-CM | POA: Diagnosis not present

## 2020-10-12 DIAGNOSIS — R109 Unspecified abdominal pain: Secondary | ICD-10-CM | POA: Diagnosis not present

## 2020-10-12 DIAGNOSIS — R519 Headache, unspecified: Secondary | ICD-10-CM | POA: Diagnosis not present

## 2020-10-12 DIAGNOSIS — Z9882 Breast implant status: Secondary | ICD-10-CM | POA: Diagnosis not present

## 2020-10-12 DIAGNOSIS — K5900 Constipation, unspecified: Secondary | ICD-10-CM | POA: Diagnosis not present

## 2020-10-12 DIAGNOSIS — R55 Syncope and collapse: Secondary | ICD-10-CM | POA: Diagnosis not present

## 2020-10-12 DIAGNOSIS — R0902 Hypoxemia: Secondary | ICD-10-CM | POA: Diagnosis not present

## 2020-10-12 DIAGNOSIS — Z9119 Patient's noncompliance with other medical treatment and regimen: Secondary | ICD-10-CM | POA: Diagnosis not present

## 2020-10-12 DIAGNOSIS — I4891 Unspecified atrial fibrillation: Secondary | ICD-10-CM | POA: Diagnosis not present

## 2020-10-12 DIAGNOSIS — I480 Paroxysmal atrial fibrillation: Secondary | ICD-10-CM | POA: Diagnosis not present

## 2020-10-12 DIAGNOSIS — R778 Other specified abnormalities of plasma proteins: Secondary | ICD-10-CM | POA: Diagnosis not present

## 2020-10-12 DIAGNOSIS — R079 Chest pain, unspecified: Secondary | ICD-10-CM | POA: Diagnosis not present

## 2020-10-12 DIAGNOSIS — Z20822 Contact with and (suspected) exposure to covid-19: Secondary | ICD-10-CM | POA: Diagnosis not present

## 2020-10-12 DIAGNOSIS — R000 Tachycardia, unspecified: Secondary | ICD-10-CM | POA: Diagnosis not present

## 2020-10-12 DIAGNOSIS — R001 Bradycardia, unspecified: Secondary | ICD-10-CM | POA: Diagnosis not present

## 2020-10-12 DIAGNOSIS — E876 Hypokalemia: Secondary | ICD-10-CM | POA: Diagnosis not present

## 2020-10-13 DIAGNOSIS — I4891 Unspecified atrial fibrillation: Secondary | ICD-10-CM | POA: Diagnosis not present

## 2020-10-13 DIAGNOSIS — R030 Elevated blood-pressure reading, without diagnosis of hypertension: Secondary | ICD-10-CM | POA: Diagnosis not present

## 2020-10-13 DIAGNOSIS — R0602 Shortness of breath: Secondary | ICD-10-CM | POA: Diagnosis not present

## 2020-10-13 DIAGNOSIS — I480 Paroxysmal atrial fibrillation: Secondary | ICD-10-CM | POA: Diagnosis not present

## 2020-10-13 DIAGNOSIS — R55 Syncope and collapse: Secondary | ICD-10-CM | POA: Diagnosis not present

## 2020-10-13 DIAGNOSIS — R7989 Other specified abnormal findings of blood chemistry: Secondary | ICD-10-CM | POA: Diagnosis not present

## 2020-10-13 DIAGNOSIS — E039 Hypothyroidism, unspecified: Secondary | ICD-10-CM | POA: Diagnosis not present

## 2020-10-14 DIAGNOSIS — I4891 Unspecified atrial fibrillation: Secondary | ICD-10-CM | POA: Diagnosis not present

## 2020-10-14 DIAGNOSIS — R0602 Shortness of breath: Secondary | ICD-10-CM | POA: Diagnosis not present

## 2020-11-25 DIAGNOSIS — E876 Hypokalemia: Secondary | ICD-10-CM | POA: Diagnosis not present

## 2020-11-25 DIAGNOSIS — E039 Hypothyroidism, unspecified: Secondary | ICD-10-CM | POA: Diagnosis not present

## 2020-11-25 DIAGNOSIS — I480 Paroxysmal atrial fibrillation: Secondary | ICD-10-CM | POA: Diagnosis not present

## 2020-11-25 DIAGNOSIS — R55 Syncope and collapse: Secondary | ICD-10-CM | POA: Diagnosis not present

## 2020-11-25 DIAGNOSIS — R002 Palpitations: Secondary | ICD-10-CM | POA: Diagnosis not present

## 2020-11-27 ENCOUNTER — Telehealth: Payer: Self-pay

## 2020-11-27 NOTE — Telephone Encounter (Signed)
Referral notes received from [facility], Phone #: [phone], Fax #: [phone]  Notes sent to scheduling

## 2021-01-28 DIAGNOSIS — I10 Essential (primary) hypertension: Secondary | ICD-10-CM | POA: Diagnosis not present

## 2021-01-28 DIAGNOSIS — E039 Hypothyroidism, unspecified: Secondary | ICD-10-CM | POA: Diagnosis not present

## 2021-02-03 ENCOUNTER — Institutional Professional Consult (permissible substitution): Payer: BC Managed Care – PPO | Admitting: Internal Medicine

## 2021-02-10 ENCOUNTER — Other Ambulatory Visit: Payer: Self-pay

## 2021-02-10 ENCOUNTER — Ambulatory Visit: Payer: BC Managed Care – PPO | Admitting: Internal Medicine

## 2021-02-10 ENCOUNTER — Encounter: Payer: Self-pay | Admitting: Internal Medicine

## 2021-02-10 VITALS — BP 133/83 | HR 64 | Ht 60.0 in | Wt 137.0 lb

## 2021-02-10 DIAGNOSIS — R55 Syncope and collapse: Secondary | ICD-10-CM | POA: Diagnosis not present

## 2021-02-10 DIAGNOSIS — R002 Palpitations: Secondary | ICD-10-CM | POA: Diagnosis not present

## 2021-02-10 NOTE — Patient Instructions (Addendum)
Medication Instructions:  Your physician recommends that you continue on your current medications as directed. Please refer to the Current Medication list given to you today.  Labwork: None ordered.  Testing/Procedures: None ordered.  Follow-Up: Your physician wants you to follow-up in: 6 months with Gregg Taylor, MD or one of the following Advanced Practice Providers on your designated Care Team:   Renee Ursuy, PA-C Michael "Andy" Tillery, PA-C   Any Other Special Instructions Will Be Listed Below (If Applicable).  If you need a refill on your cardiac medications before your next appointment, please call your pharmacy.    

## 2021-02-10 NOTE — Progress Notes (Signed)
HPI Carolyn Mccall is referred today by Carolyn Mccall for ongoing evaluation of palpitations and atrial fib. She is a pleasant 45 yo woman with a h/o autonomic dysfunction. She cannot tell me how many times she has passed out in her life. She was on steroids and developed atrial fib. She has developed HTN. She admits to consuming ETOH and caffeine in excess. She was seen by Dr. Graciela Husbands in the past, last 6 years ago. She has a h/o anxiety.  Allergies  Allergen Reactions   Effexor [Venlafaxine] Other (See Comments)    Pt states it makes her crazy of she misses a dose   Lexapro [Escitalopram Oxalate] Other (See Comments)    Abdominal pain     Current Outpatient Medications  Medication Sig Dispense Refill   cetirizine (ZYRTEC) 10 MG tablet Take 10 mg by mouth daily.     Cholecalciferol 250 MCG (10000 UT) CAPS Take 1 capsule by mouth daily.     fluticasone (FLONASE) 50 MCG/ACT nasal spray SPRAY 2 SPRAYS IN NOSTRILS AS NEEDED     levothyroxine (SYNTHROID) 75 MCG tablet Take 75 mcg by mouth daily.     valsartan-hydrochlorothiazide (DIOVAN-HCT) 160-12.5 MG tablet Take 1 tablet by mouth daily.     No current facility-administered medications for this visit.     Past Medical History:  Diagnosis Date   Abnormal Pap smear of cervix    Acid reflux    Fibrocystic breast    Hypothyroidism    Migraine    Syncope    Ulcer of intestine    Vitamin D deficiency     ROS:   All systems reviewed and negative except as noted in the HPI.   Past Surgical History:  Procedure Laterality Date   BREAST SURGERY     breast augmentation   ESOPHAGOGASTRODUODENOSCOPY     WISDOM TOOTH EXTRACTION       Family History  Problem Relation Age of Onset   Stroke Father    Heart disease Father    Hypertension Father    Hypertension Mother    Heart disease Brother    Cancer Maternal Aunt    Cancer Maternal Uncle      Social History   Socioeconomic History   Marital status: Single    Spouse  name: Not on file   Number of children: 2   Years of education: college   Highest education level: Not on file  Occupational History    Employer: [employer]  Tobacco Use   Smoking status: Never   Smokeless tobacco: Never  Substance and Sexual Activity   Alcohol use: Yes    Alcohol/week: 0.0 standard drinks    Comment: social   Drug use: No   Sexual activity: Not on file  Other Topics Concern   Not on file  Social History Narrative   Patient is single with 2 children.   Patient is right handed.   Patient has college education.   Patient drinks 1 cup daily.      Social Determinants of Health   Financial Resource Strain: Not on file  Food Insecurity: Not on file  Transportation Needs: Not on file  Physical Activity: Not on file  Stress: Not on file  Social Connections: Not on file  Intimate Partner Violence: Not on file     BP 133/83   Pulse 64   Ht 5' (1.524 m)   Wt 137 lb (62.1 kg)   SpO2 99%   BMI 26.76 kg/m  Physical Exam:  Well appearing NAD HEENT: Unremarkable Neck:  No JVD, no thyromegally Lymphatics:  No adenopathy Back:  No CVA tenderness Lungs:  Clear with no wheezes HEART:  Regular rate rhythm, no murmurs, no rubs, no clicks Abd:  soft, positive bowel sounds, no organomegally, no rebound, no guarding Ext:  2 plus pulses, no edema, no cyanosis, no clubbing Skin:  No rashes no nodules Neuro:  CN II through XII intact, motor grossly intact  EKG - nsr  Assess/Plan:  Palpitations  - her symptoms are increased but she admits to being under increased stress and consuming too much caffeine and ETOH. I offered her a trial of low dose flecainide as she has PAC's and PVC's but she would prefer to try dietary changes. She will call us if this changes. HTN - her bp is controlled today. She will continue her current regimen.  Syncope - if she were to develop worsening symptoms I would first have her stop the HCTZ. PAF - I do not have any definitive  documentation. Her CHADSVASC score is 2 but one point is being female. She will undergo watchful waiting. It appears the atrial fib was related to steroid use.  Lewayne Bunting, MD

## 2021-02-11 ENCOUNTER — Telehealth: Payer: Self-pay

## 2021-02-11 NOTE — Telephone Encounter (Signed)
NOTES SCANNED TO REFERRAL 

## 2021-03-30 DIAGNOSIS — Z23 Encounter for immunization: Secondary | ICD-10-CM | POA: Diagnosis not present

## 2021-03-30 DIAGNOSIS — E785 Hyperlipidemia, unspecified: Secondary | ICD-10-CM | POA: Diagnosis not present

## 2021-03-30 DIAGNOSIS — E559 Vitamin D deficiency, unspecified: Secondary | ICD-10-CM | POA: Diagnosis not present

## 2021-03-30 DIAGNOSIS — N912 Amenorrhea, unspecified: Secondary | ICD-10-CM | POA: Diagnosis not present

## 2021-03-30 DIAGNOSIS — E039 Hypothyroidism, unspecified: Secondary | ICD-10-CM | POA: Diagnosis not present

## 2021-03-30 DIAGNOSIS — I10 Essential (primary) hypertension: Secondary | ICD-10-CM | POA: Diagnosis not present

## 2021-03-30 DIAGNOSIS — Z0000 Encounter for general adult medical examination without abnormal findings: Secondary | ICD-10-CM | POA: Diagnosis not present

## 2021-05-28 DIAGNOSIS — M62838 Other muscle spasm: Secondary | ICD-10-CM | POA: Diagnosis not present

## 2021-05-28 DIAGNOSIS — R202 Paresthesia of skin: Secondary | ICD-10-CM | POA: Diagnosis not present

## 2021-07-06 DIAGNOSIS — Z1231 Encounter for screening mammogram for malignant neoplasm of breast: Secondary | ICD-10-CM | POA: Diagnosis not present

## 2021-07-15 ENCOUNTER — Encounter: Payer: Self-pay | Admitting: *Deleted

## 2021-07-17 ENCOUNTER — Encounter: Payer: Self-pay | Admitting: Diagnostic Neuroimaging

## 2021-07-17 ENCOUNTER — Ambulatory Visit: Payer: BC Managed Care – PPO | Admitting: Diagnostic Neuroimaging

## 2021-07-17 VITALS — BP 118/72 | HR 62 | Ht 60.0 in | Wt 139.6 lb

## 2021-07-17 DIAGNOSIS — G514 Facial myokymia: Secondary | ICD-10-CM

## 2021-07-17 DIAGNOSIS — M5412 Radiculopathy, cervical region: Secondary | ICD-10-CM

## 2021-07-17 NOTE — Progress Notes (Signed)
? ?[facility] ? ?PATIENT: Carolyn Mccall ?DOB: 07/19/1975 ? ?REFERRING CLINICIAN: Laurann MontanaWhite, Cynthia, MD  ?HISTORY FROM: patient ?REASON FOR VISIT: new consult ? ? ?HISTORICAL ? ?CHIEF COMPLAINT:  ?Chief Complaint  ?Patient presents with  ? New Patient (Initial Visit)  ?  Rm 7, alone.  L facial fasiculations,  since 05-28-21 Cyclobenzaprine resolved sx.   ? ? ?HISTORY OF PRESENT ILLNESS:  ? ?UPDATE (07/17/21, VRP): Since last visit, doing well until Jan 2023 --> left facial twitching x could weeks, then resolved. Now having more aches, pains, insomnia due to pain, esp in left neck and left arm. More stress with work and home. Son passed in 2021. 3 other family members have passed away in the last 4 years.  ? ?UPDATE 04/14/16 (VRP): Since last visit, HA were improved, until few weeks ago. Also now with increased job stress. Also on the way here, found out her friend just died. Tried lexapro for a few days in Aug 2017, but stopped due to GI pain / side effect. Rizatriptan as needed helps for severe headaches.  ? ?UPDATE 12/24/15: Since last visit, had a baby girl (healthy), but since then now with more HA. Daily HA, global pain, eye pain, photosensitive. Now on daily ibuprofen.  ? ?UPDATE 05/21/14 (VRP): Since last visit, HA are stable. MRI done and reviewed. Still trying to conceive. Taking tylenol as needed for HA/pain. HA are mainly before menstrual cycle. ? ?UPDATE 03/04/14 (LL): Since last visit, headaches are unchanged.  Could not afford to schedule MRI due to high ins. deductible.  Occasionally having sharp occipital pains. She has since married and not preventing pregnancy. Complains of occasional lightheadedness and palpitations.  Other times has vertigo sensation and takes meclizine. Denies nausea or vomiting, balance difficulties, phonophobia or photophobia. No changes in vision. Using Flexeril prn for neck pain and stiffness. She is Solicitorchanging insurance companies and wants to have MRI  scheduled when she has new plan info. ? ?UPDATE 11/27/13 (LL): Since last visit patient's headaches are unchanged, gabapentin has helped her sleep but has not change the frequency or intensity of the headaches. She is currently having daily headaches, treating them with Tylenol. Sharp occipital pains are intermittent and may last from 5-20 minutes at a time. She is contemplating having a child with her long-term fianc? and is currently off birth control.  She has a constant feeling of pressure in her neck which is relieved by neck popping. She has neck pain and stiffness, and pain shooting from her neck to in-between her shoulder blades. ? ?PRIOR HPI (08/28/13): 46 year old right-handed female with history of migraine, here for evaluation of headaches. At age 46 years old patient developed new onset of migraine headaches consisting of global headache, nausea, vomiting, photophobia and phonophobia. Headaches last weeks a time. She was having up to 2 weeks of migraine per month. Sometimes she would pass out with the pain. Patient was tried on Effexor initially but she had intolerance. She was tried on another migraine medication, but doesn't remember the name, and after 2 weeks her migraine significantly improve. Patient stopped that medication and since that time patient has had significant reduction in migraine headaches. Nowadays she only has one severe migraine per year. Over past couple of years patient has had a new type of symptom/headache consisting of intermittent, brief sharp shooting "pinch" pain in the right or left parietal occipital region in the scalp. Symptoms occur without warning. No specific triggers. When the pain hits, she  grabs her head and weeks for to go away. Sometimes she has closed eyes because the pain is so severe. No nausea vomiting, photophobia or phonophobia with these new symptoms. Patient was diagnosed with possible occipital neuralgia and treated with steroid injection/trigger point  injection, without relief. Patient is having these twice per week. 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 Patient has some neck pain. ? ? ?REVIEW OF SYSTEMS: Full 14 system review of systems performed and negative except: ringing in ears runny nose headaches.  ? ? ?ALLERGIES: ?Allergies  ?Allergen Reactions  ? Aspirin-Acetaminophen-Caffeine   ?  Other reaction(s): Dizziness (intolerance), Other ?Headache ?Headache ?  ? Doxycycline   ?  Other reaction(s): AFIB  ? Effexor [Venlafaxine] Other (See Comments)  ?  Pt states it makes her crazy of she misses a dose  ? Lexapro [Escitalopram Oxalate] Other (See Comments)  ?  Abdominal pain  ? Metoclopramide   ?  Other reaction(s): Other (See Comments)  ? Prednisone   ?  Other reaction(s): AFIB and swelling  ? ? ?HOME MEDICATIONS: ?Outpatient Medications Prior to Visit  ?Medication Sig Dispense Refill  ? cetirizine (ZYRTEC) 10 MG tablet Take 10 mg by mouth daily as needed.    ? Cholecalciferol (VITAMIN D) 125 MCG (5000 UT) CAPS Take by mouth daily.    ? cyclobenzaprine (FLEXERIL) 5 MG tablet Take 5 mg by mouth 3 (three) times daily as needed.    ? fluticasone (FLONASE) 50 MCG/ACT nasal spray SPRAY 2 SPRAYS IN NOSTRILS AS NEEDED    ? levothyroxine (SYNTHROID) 75 MCG tablet Take 75 mcg by mouth daily.    ? OVER THE COUNTER MEDICATION Provitalize 2 caps daily    ? valsartan-hydrochlorothiazide (DIOVAN-HCT) 160-12.5 MG tablet Take 1 tablet by mouth daily.    ? Cholecalciferol 250 MCG (10000 UT) CAPS Take 1 capsule by mouth daily. (Patient not taking: Reported on 07/17/2021)    ? ?No facility-administered medications prior to visit.  ? ? ?PAST MEDICAL HISTORY: ?Past Medical History:  ?Diagnosis Date  ? Abnormal Pap smear of cervix   ? Acid reflux   ? Facial paresthesia   ? Fibrocystic breast   ? Hypothyroidism   ? Migraine   ? Muscle spasm   ? Occipital neuralgia   ? Syncope   ? Ulcer of intestine   ? Vitamin D deficiency    ? ? ?PAST SURGICAL HISTORY: ?Past Surgical History:  ?Procedure Laterality Date  ? BREAST SURGERY    ? breast augmentation  ? ESOPHAGOGASTRODUODENOSCOPY    ? WISDOM TOOTH EXTRACTION    ? ? ?FAMILY HISTORY: ?Family History  ?Problem Relation Age of Onset  ? Stroke Father   ? Heart disease Father   ? Hypertension Father   ? Hypertension Mother   ? Heart disease Brother   ? Cancer Maternal Aunt   ? Cancer Maternal Uncle   ? ? ?SOCIAL HISTORY: ? ?Social History  ? ?Socioeconomic History  ? Marital status: Married  ?  Spouse name: Not on file  ? Number of children: 4  ? Years of education: college  ? Highest education level: Not on file  ?Occupational History  ?  Employer: [employer]  ?Tobacco Use  ? Smoking status: Never  ? Smokeless tobacco: Never  ?Vaping Use  ? Vaping Use: Never used  ?Substance and Sexual Activity  ? Alcohol use: Yes  ?  Alcohol/week: 0.0 standard drinks  ?  Comment: social  ? Drug use: No  ?  Sexual activity: Not on file  ?Other Topics Concern  ? Not on file  ?Social History Narrative  ?   ? Patient is right handed.  ? Patient has college education.  ? Patient drinks 1-2 cup daily.  ?   ? ?Social Determinants of Health  ? ?Financial Resource Strain: Not on file  ?Food Insecurity: Not on file  ?Transportation Needs: Not on file  ?Physical Activity: Not on file  ?Stress: Not on file  ?Social Connections: Not on file  ?Intimate Partner Violence: Not on file  ? ? ? ?PHYSICAL EXAM ? ?Vitals:  ? 07/17/21 1113  ?BP: 118/72  ?Pulse: 62  ?Weight: 139 lb 9.6 oz (63.3 kg)  ?Height: 5' (1.524 m)  ? ?Not recorded ?  ? ?Wt Readings from Last 3 Encounters:  ?07/17/21 139 lb 9.6 oz (63.3 kg)  ?02/10/21 137 lb (62.1 kg)  ?11/10/18 139 lb 12.8 oz (63.4 kg)  ? ?Body mass index is 27.26 kg/m?. ? ?GENERAL EXAM: ?Patient is in no distress; well developed, nourished and groomed; neck is supple ? ?CARDIOVASCULAR: ?Regular rate and rhythm, no murmurs, no carotid bruits ? ?NEUROLOGIC: ?MENTAL STATUS: awake, alert, language  fluent, comprehension intact, naming intact, fund of knowledge appropriate ?CRANIAL NERVE: pupils equal and reactive to light, visual fields full to confrontation, extraocular muscles intact, no nystagmus, facial sens

## 2021-07-22 ENCOUNTER — Telehealth: Payer: Self-pay | Admitting: Diagnostic Neuroimaging

## 2021-07-22 DIAGNOSIS — M5412 Radiculopathy, cervical region: Secondary | ICD-10-CM

## 2021-07-22 NOTE — Telephone Encounter (Signed)
Hey [employer] will approve MRI of the Brain, but not the MRI of the cervical spine because they are wanting her to have PT. Do you still want the patient to have the MRI of the brain? ?

## 2021-07-27 NOTE — Telephone Encounter (Signed)
I did I told her for one exam for her it would cost about $1,479.53 because she has not met her ded. Which is still remaining $1,408.93. that is why she wanted to wait and get both exams and pay it all at once. I also offered her the payment plan option.  ?

## 2021-07-27 NOTE — Telephone Encounter (Signed)
I spoke with the patient to schedule her MRI Brain.. I informed her that right now her insurance is not approving the MRI Cervical spine because they are asking for her to try PT.. she stated she does not want to just do the MRI Brain because she believes the pain is not coming from her head. She stated she wants a MRI of her whole body from her head to her toe to see what is going on.  ? ?She stated that she would rather try PT first because she if she is going to pay for the MRI she wants to both for both of them at once. ? ? ?

## 2021-08-05 ENCOUNTER — Telehealth: Payer: Self-pay | Admitting: Diagnostic Neuroimaging

## 2021-08-05 NOTE — Addendum Note (Signed)
Addended by: Minna Antis on: 08/05/2021 09:44 AM ? ? Modules accepted: Orders ? ?

## 2021-08-05 NOTE — Telephone Encounter (Signed)
Referral for PT/OT sent to [facility] [phone]. ?

## 2021-08-05 NOTE — Telephone Encounter (Signed)
Orders placed for PT/OT.

## 2021-08-28 DIAGNOSIS — R0789 Other chest pain: Secondary | ICD-10-CM | POA: Diagnosis not present

## 2021-08-28 DIAGNOSIS — R52 Pain, unspecified: Secondary | ICD-10-CM | POA: Diagnosis not present

## 2021-08-28 DIAGNOSIS — R5383 Other fatigue: Secondary | ICD-10-CM | POA: Diagnosis not present

## 2021-08-28 DIAGNOSIS — M542 Cervicalgia: Secondary | ICD-10-CM | POA: Diagnosis not present

## 2021-08-29 ENCOUNTER — Encounter (HOSPITAL_BASED_OUTPATIENT_CLINIC_OR_DEPARTMENT_OTHER): Payer: Self-pay | Admitting: Emergency Medicine

## 2021-08-29 ENCOUNTER — Emergency Department (HOSPITAL_BASED_OUTPATIENT_CLINIC_OR_DEPARTMENT_OTHER)
Admission: EM | Admit: 2021-08-29 | Discharge: 2021-08-29 | Disposition: A | Discharge status: Home / Self Care | Payer: BC Managed Care – PPO | Attending: Emergency Medicine | Admitting: Emergency Medicine

## 2021-08-29 ENCOUNTER — Other Ambulatory Visit: Payer: Self-pay

## 2021-08-29 DIAGNOSIS — R519 Headache, unspecified: Secondary | ICD-10-CM | POA: Insufficient documentation

## 2021-08-29 DIAGNOSIS — R42 Dizziness and giddiness: Secondary | ICD-10-CM | POA: Insufficient documentation

## 2021-08-29 DIAGNOSIS — M542 Cervicalgia: Secondary | ICD-10-CM | POA: Insufficient documentation

## 2021-08-29 DIAGNOSIS — R110 Nausea: Secondary | ICD-10-CM | POA: Diagnosis not present

## 2021-08-29 DIAGNOSIS — M545 Low back pain, unspecified: Secondary | ICD-10-CM | POA: Insufficient documentation

## 2021-08-29 DIAGNOSIS — Z79899 Other long term (current) drug therapy: Secondary | ICD-10-CM | POA: Insufficient documentation

## 2021-08-29 HISTORY — DX: Essential (primary) hypertension: I10

## 2021-08-29 LAB — URINALYSIS, ROUTINE W REFLEX MICROSCOPIC
Bilirubin Urine: NEGATIVE
Glucose, UA: NEGATIVE mg/dL
Hgb urine dipstick: NEGATIVE
Ketones, ur: NEGATIVE mg/dL
Nitrite: NEGATIVE
Protein, ur: NEGATIVE mg/dL
Specific Gravity, Urine: <1.005 — ABNORMAL LOW (ref 1.005–1.030)
pH: 6.5 (ref 5.0–8.0)

## 2021-08-29 LAB — CBC WITH DIFFERENTIAL/PLATELET
Abs Immature Granulocytes: 0.01 10*3/uL (ref 0.00–0.07)
Basophils Absolute: 0 10*3/uL (ref 0.0–0.1)
Basophils Relative: 0 %
Eosinophils Absolute: 0.1 10*3/uL (ref 0.0–0.5)
Eosinophils Relative: 2 %
HCT: 39.2 % (ref 36.0–46.0)
Hemoglobin: 12.7 g/dL (ref 12.0–15.0)
Immature Granulocytes: 0 %
Lymphocytes Relative: 37 %
Lymphs Abs: 1.8 10*3/uL (ref 0.7–4.0)
MCH: 30.9 pg (ref 26.0–34.0)
MCHC: 32.4 g/dL (ref 30.0–36.0)
MCV: 95.4 fL (ref 80.0–100.0)
Monocytes Absolute: 0.4 10*3/uL (ref 0.1–1.0)
Monocytes Relative: 7 %
Neutro Abs: 2.5 10*3/uL (ref 1.7–7.7)
Neutrophils Relative %: 54 %
Platelets: 257 10*3/uL (ref 150–400)
RBC: 4.11 MIL/uL (ref 3.87–5.11)
RDW: 12.5 % (ref 11.5–15.5)
WBC: 4.7 10*3/uL (ref 4.0–10.5)
nRBC: 0 % (ref 0.0–0.2)

## 2021-08-29 LAB — BASIC METABOLIC PANEL
Anion gap: 6 (ref 5–15)
BUN: 10 mg/dL (ref 6–20)
CO2: 32 mmol/L (ref 22–32)
Calcium: 9.4 mg/dL (ref 8.9–10.3)
Chloride: 101 mmol/L (ref 98–111)
Creatinine, Ser: 0.68 mg/dL (ref 0.44–1.00)
GFR, Estimated: >60 mL/min (ref >60)
Glucose, Bld: 89 mg/dL (ref 70–99)
Potassium: 3.2 mmol/L — ABNORMAL LOW (ref 3.5–5.1)
Sodium: 139 mmol/L (ref 135–145)

## 2021-08-29 LAB — PREGNANCY, URINE: Preg Test, Ur: NEGATIVE

## 2021-08-29 MED ORDER — KETOROLAC TROMETHAMINE 15 MG/ML IJ SOLN
15.0000 mg | Freq: Once | INTRAMUSCULAR | Status: AC
  Administered 2021-08-29: 15 mg via INTRAVENOUS
  Filled 2021-08-29: qty 1

## 2021-08-29 MED ORDER — POTASSIUM CHLORIDE CRYS ER 20 MEQ PO TBCR
40.0000 meq | EXTENDED_RELEASE_TABLET | Freq: Once | ORAL | Status: AC
  Administered 2021-08-29: 40 meq via ORAL
  Filled 2021-08-29: qty 2

## 2021-08-29 NOTE — Discharge Instructions (Signed)
Your labs are unremarkable.  Your blood levels were normal.  Your potassium was mildly low and you were given a supplement today. ? ?Follow-up with your neurologist within 1 to 2 weeks and follow-up with your MRI. ? ?Return to the ER if you have worsening symptoms fevers or any additional concerns. ?

## 2021-08-29 NOTE — ED Triage Notes (Signed)
Pt c/o headache and back pain with light sensitivity and nausea onset beginning of the week. Pt seen by PCP at [facility] yesterday and was told to follow up here to rule out meningitis.  ?

## 2021-08-29 NOTE — ED Provider Notes (Addendum)
?[facility] ?Provider Note ? ? ?CSN: [id number] ?Arrival date & time: 08/29/21  1240 ? ?  ? ?History ? ?Chief Complaint  ?Patient presents with  ? Headache  ? ? ?Carolyn Mccall is a 46 y.o. female. ? ?Patient presents chief complaint of headache, light sensitivity, neck and back pain.  Describes sensation as a dullness sensation ongoing for the past week or so.  Symptoms were gradual in onset and persistent throughout the week.  They are made worse with light.  Denies any cough or fevers.  Positive nausea but no vomiting or diarrhea.  Patient was seen in urgent care yesterday had EKG done and was directed to go to the ER for evaluation.  However the patient has other things to do and presents the ER today instead.  States symptoms are unchanged today. ? ? ?  ? ?Home Medications ?Prior to Admission medications   ?Medication Sig Start Date End Date Taking? Authorizing Provider  ?cetirizine (ZYRTEC) 10 MG tablet Take 10 mg by mouth daily as needed.    [provider]  ?Cholecalciferol (VITAMIN D) 125 MCG (5000 UT) CAPS Take by mouth daily.    [provider]  ?cyclobenzaprine (FLEXERIL) 5 MG tablet Take 5 mg by mouth 3 (three) times daily as needed. 05/28/21   [provider]  ?fluticasone (FLONASE) 50 MCG/ACT nasal spray SPRAY 2 SPRAYS IN NOSTRILS AS NEEDED 11/13/15   [provider]  ?levothyroxine (SYNTHROID) 75 MCG tablet Take 75 mcg by mouth daily. 02/10/21   [provider]  ?OVER THE COUNTER MEDICATION Provitalize 2 caps daily    [provider]  ?valsartan-hydrochlorothiazide (DIOVAN-HCT) 160-12.5 MG tablet Take 1 tablet by mouth daily. 01/28/21   [provider]  ?   ? ?Allergies    ?Aspirin-acetaminophen-caffeine, Doxycycline, Effexor [venlafaxine], Lexapro [escitalopram oxalate], Metoclopramide, and Prednisone   ? ?Review of Systems   ?Review of Systems  ?Constitutional:  Negative for fever.  ?HENT:  Negative for ear  pain.   ?Eyes:  Negative for pain.  ?Respiratory:  Negative for cough.   ?Cardiovascular:  Negative for chest pain.  ?Gastrointestinal:  Negative for abdominal pain.  ?Genitourinary:  Negative for flank pain.  ?Musculoskeletal:  Negative for back pain.  ?Skin:  Negative for rash.  ?Neurological:  Positive for headaches.  ? ?Physical Exam ?Updated Vital Signs ?BP 139/72 (BP Location: Right Arm)   Pulse (!) 56   Temp 99 ?F (37.2 ?C)   Resp 16   Ht 5' (1.524 m)   Wt 62.1 kg   LMP 08/15/2021   SpO2 100%   Breastfeeding No   BMI 26.76 kg/m?  ?Physical Exam ?Constitutional:   ?   General: She is not in acute distress. ?   Appearance: Normal appearance.  ?HENT:  ?   Head: Normocephalic.  ?   Nose: Nose normal.  ?Eyes:  ?   Extraocular Movements: Extraocular movements intact.  ?Cardiovascular:  ?   Rate and Rhythm: Normal rate.  ?Pulmonary:  ?   Effort: Pulmonary effort is normal.  ?Musculoskeletal:     ?   General: Normal range of motion.  ?   Cervical back: Normal range of motion.  ?   Comments: Patient has full range of motion of C-spine for an anterior flexion left and right rotation.  She is able to rotate her torso left and right and bend forward with out any notable discomfort or pain. ? ?No C or T or L-spine midline step-offs or  tenderness.  Gait is normal, at ease and without pain.  ?Neurological:  ?   General: No focal deficit present.  ?   Mental Status: She is alert and oriented to person, place, and time. Mental status is at baseline.  ?   Cranial Nerves: No cranial nerve deficit.  ?   Motor: No weakness.  ?   Gait: Gait normal.  ?   Comments: Patient has no focal neurodeficit.  ? ? ?ED Results / Procedures / Treatments   ?Labs ?(all labs ordered are listed, but only abnormal results are displayed) ?Labs Reviewed  ?CBC WITH DIFFERENTIAL/PLATELET  ?BASIC METABOLIC PANEL  ?URINALYSIS, ROUTINE W REFLEX MICROSCOPIC  ?PREGNANCY, URINE  ? ? ?EKG ?None ? ?Radiology ?No results found. ? ?Procedures ?Procedures   ? ? ?Medications Ordered in ED ?Medications  ?ketorolac (TORADOL) 15 MG/ML injection 15 mg (has no administration in time range)  ? ? ?ED Course/ Medical Decision Making/ A&P ?  ?                        ?Medical Decision Making ?Amount and/or Complexity of Data Reviewed ?Labs: ordered. ? ?Risk ?Prescription drug management. ? ? ?Chart review shows neurology evaluation August 05, 2021. ? ?On exam patient appears comfortable smiling in no acute distress. ? ?She states that she was told she might have meningitis.  I find this is unlikely given she has had a week of headache neck and back pain with no fevers and appears to have a normal exam today without meningismal signs. ? ?Labs were sent given she has persistent episodes of lightheadedness.  Urinalysis sent for evaluation for her back pain. ? ?Work-up is unremarkable.  Clinically doubt meningitis.  Recommended continued follow-up with her neurologist this week and to pursue the MRI of her brain which has already been arranged. ? ? ? ? ?Final Clinical Impression(s) / ED Diagnoses ?Final diagnoses:  ?Nonintractable headache, unspecified chronicity pattern, unspecified headache type  ? ? ?Rx / DC Orders ?ED Discharge Orders   ? ? None  ? ?  ? ? ?  ?Luna Fuse, MD ?08/29/21 1426 ? ?  ?Luna Fuse, MD ?08/29/21 1503 ? ?

## 2021-08-29 NOTE — ED Notes (Signed)
Dc instructions and scripts reviewed with pt no questions or concerns at this time.  

## 2021-09-02 ENCOUNTER — Ambulatory Visit: Payer: BC Managed Care – PPO | Attending: Diagnostic Neuroimaging | Admitting: Physical Therapy

## 2021-09-23 DIAGNOSIS — E876 Hypokalemia: Secondary | ICD-10-CM | POA: Diagnosis not present

## 2021-09-23 DIAGNOSIS — E785 Hyperlipidemia, unspecified: Secondary | ICD-10-CM | POA: Diagnosis not present

## 2021-09-23 DIAGNOSIS — I10 Essential (primary) hypertension: Secondary | ICD-10-CM | POA: Diagnosis not present

## 2021-09-23 DIAGNOSIS — E039 Hypothyroidism, unspecified: Secondary | ICD-10-CM | POA: Diagnosis not present

## 2021-10-13 DIAGNOSIS — L810 Postinflammatory hyperpigmentation: Secondary | ICD-10-CM | POA: Diagnosis not present

## 2021-10-13 DIAGNOSIS — L700 Acne vulgaris: Secondary | ICD-10-CM | POA: Diagnosis not present

## 2021-10-13 DIAGNOSIS — L820 Inflamed seborrheic keratosis: Secondary | ICD-10-CM | POA: Diagnosis not present

## 2021-10-13 DIAGNOSIS — L209 Atopic dermatitis, unspecified: Secondary | ICD-10-CM | POA: Diagnosis not present

## 2021-12-16 DIAGNOSIS — R002 Palpitations: Secondary | ICD-10-CM | POA: Diagnosis not present

## 2021-12-16 DIAGNOSIS — R5383 Other fatigue: Secondary | ICD-10-CM | POA: Diagnosis not present

## 2021-12-16 DIAGNOSIS — F4321 Adjustment disorder with depressed mood: Secondary | ICD-10-CM | POA: Diagnosis not present

## 2021-12-16 DIAGNOSIS — K6289 Other specified diseases of anus and rectum: Secondary | ICD-10-CM | POA: Diagnosis not present

## 2021-12-25 ENCOUNTER — Ambulatory Visit: Payer: BC Managed Care – PPO

## 2021-12-25 ENCOUNTER — Other Ambulatory Visit: Payer: Self-pay | Admitting: *Deleted

## 2021-12-25 ENCOUNTER — Encounter: Payer: Self-pay | Admitting: *Deleted

## 2021-12-25 DIAGNOSIS — R002 Palpitations: Secondary | ICD-10-CM

## 2021-12-25 NOTE — Progress Notes (Unsigned)
Enrolled for [employer] to mail a ZIO XT long term holter monitor to the patients address on file.   Letter with instructions in [place] mailed to patient.  Dr. Graciela Husbands to read.

## 2022-01-26 DIAGNOSIS — M62838 Other muscle spasm: Secondary | ICD-10-CM | POA: Diagnosis not present

## 2022-01-26 DIAGNOSIS — F4321 Adjustment disorder with depressed mood: Secondary | ICD-10-CM | POA: Diagnosis not present

## 2022-02-12 DIAGNOSIS — R002 Palpitations: Secondary | ICD-10-CM | POA: Diagnosis not present

## 2022-04-02 DIAGNOSIS — J069 Acute upper respiratory infection, unspecified: Secondary | ICD-10-CM | POA: Diagnosis not present

## 2022-04-14 DIAGNOSIS — E559 Vitamin D deficiency, unspecified: Secondary | ICD-10-CM | POA: Diagnosis not present

## 2022-04-14 DIAGNOSIS — R55 Syncope and collapse: Secondary | ICD-10-CM | POA: Diagnosis not present

## 2022-04-14 DIAGNOSIS — E039 Hypothyroidism, unspecified: Secondary | ICD-10-CM | POA: Diagnosis not present

## 2022-04-14 DIAGNOSIS — Z0000 Encounter for general adult medical examination without abnormal findings: Secondary | ICD-10-CM | POA: Diagnosis not present

## 2022-04-14 DIAGNOSIS — Z124 Encounter for screening for malignant neoplasm of cervix: Secondary | ICD-10-CM | POA: Diagnosis not present

## 2022-04-14 DIAGNOSIS — E785 Hyperlipidemia, unspecified: Secondary | ICD-10-CM | POA: Diagnosis not present

## 2022-04-14 DIAGNOSIS — I10 Essential (primary) hypertension: Secondary | ICD-10-CM | POA: Diagnosis not present

## 2022-04-15 NOTE — Progress Notes (Signed)
Cardiology Office Note Date:  04/15/2022  Patient ID:  Rynn, Markiewicz 05/03/1976, MRN [id number] PCP:  Laurann Montana, MD  Electrophysiologist: Dr. Graciela Husbands (2016) >> Dr. Ladona Ridgel    Chief Complaint:  6 mo  History of Present Illness: Sihaam Chrobak is a 46 y.o. female with history of dysautonomia, headaches  She last saw Dr. Graciela Husbands back in 2016 at that time he noted  "syncope Presumed secondary to dysautonomia   She was given an event recorder showed only sinus rhythm correlating with tachypalpitations  And repeated event recorder demonstrated no auto detected events. She has not had anymore problems with dizziness in her increase salt intake."  Next, she saw Dr. Ladona Ridgel 02/10/21 for palpitations and Afib.  He noted, she was on steroids and developed atrial fib, and had developed HTN. She admitted to consuming ETOH and caffeine in excess, with an increase in stress and anxiety. Offered a trial of low dose flecainide with PACs noted but preferred to try dietary/lifestyle changes 1st BP looked OK If more syncope, advised stopping HCTZ. Noted no documentation of AFIB, risk score of 2 including gender, and perhaps triggered (if AFib) by the steroid.  Planned watchful waiting for this  Follows with neurology for headaches, facial twitching, suspected occipital neuralgia  Last 07/17/21 Planned for  check MRI brain, cervical spine - consider psychiatry / psychology for insomnia, anxiety - consider PT/OT evaluations  ( No MRI in [facility] since this visit)  Wore a monitor Sept 2023, unclear who ordered it/why Patient had a min HR of 42 bpm, max HR of 193 bpm, and avg HR of 65 bpm. Predominant underlying rhythm was Sinus Rhythm. 4 Supraventricular Tachycardia runs occurred, the run with the fastest interval lasting 4 beats with a max rate of 193 bpm, the longest lasting 7 beats with an avg rate of 112 bpm.    Isolated SVEs were rare (<1.0%), SVE Couplets were rare (<1.0%), and SVE  Triplets were rare (<1.0%).    Isolated VEs were rare (<1.0%), and no VE Couplets or VE Triplets were present.    One triggered event >> sinus rhythm   TODAY She is doing pretty well She has had this issue/symptoms since she was 17, and if anything they are less often, less severe and she is better at managing. She is good about salt intake and hydration. She has had only one near syncopal event no syncope since her last visit. Events seem to start with fast heartbeats, feels shaky, hands get very cold, she gets weak and has to sit or lay down.  NO triggers, they seem random, not worse with her menses  She does not exercise but used to until something happened with her son, she looks forward to getting back to it.  Past Medical History:  Diagnosis Date   Abnormal Pap smear of cervix    Acid reflux    Facial paresthesia    Fibrocystic breast    Hypertension    Hypothyroidism    Migraine    Muscle spasm    Occipital neuralgia    Syncope    Ulcer of intestine    Vitamin D deficiency     Past Surgical History:  Procedure Laterality Date   BREAST SURGERY     breast augmentation   ESOPHAGOGASTRODUODENOSCOPY     WISDOM TOOTH EXTRACTION      Current Outpatient Medications  Medication Sig Dispense Refill   cetirizine (ZYRTEC) 10 MG tablet Take 10 mg by mouth  daily as needed.     Cholecalciferol (VITAMIN D) 125 MCG (5000 UT) CAPS Take by mouth daily.     cyclobenzaprine (FLEXERIL) 5 MG tablet Take 5 mg by mouth 3 (three) times daily as needed.     fluticasone (FLONASE) 50 MCG/ACT nasal spray SPRAY 2 SPRAYS IN NOSTRILS AS NEEDED     levothyroxine (SYNTHROID) 75 MCG tablet Take 75 mcg by mouth daily.     OVER THE COUNTER MEDICATION Provitalize 2 caps daily     valsartan-hydrochlorothiazide (DIOVAN-HCT) 160-12.5 MG tablet Take 1 tablet by mouth daily.     No current facility-administered medications for this visit.    Allergies:   Aspirin-acetaminophen-caffeine, Doxycycline,  Effexor [venlafaxine], Lexapro [escitalopram oxalate], Metoclopramide, and Prednisone   Social History:  The patient  reports that she has never smoked. She has never used smokeless tobacco. She reports current alcohol use. She reports that she does not use drugs.   Family History:  The patient's family history includes Cancer in her maternal aunt and maternal uncle; Heart disease in her brother and father; Hypertension in her father and mother; Stroke in her father.  ROS:  Please see the history of present illness.    All other systems are reviewed and otherwise negative.   PHYSICAL EXAM:  VS:  There were no vitals taken for this visit. BMI: There is no height or weight on file to calculate BMI. Well nourished, well developed, in no acute distress HEENT: normocephalic, atraumatic Neck: no JVD, carotid bruits or masses Cardiac:  RRR; no significant murmurs, no rubs, or gallops Lungs:  CTA b/l, no wheezing, rhonchi or rales Abd: soft, nontender MS: no deformity or atrophy Ext:  no edema Skin: warm and dry, no rash Neuro:  No gross deficits appreciated Psych: euthymic mood, full affect   EKG:  Done today and reviewed by myself shows  SB 56bpm, normal intervals, unchanged   Sept 2023, monitor Patch Wear Time: 13 days and 12 hour  Patient had a min HR of 42 bpm, max HR of 193 bpm, and avg HR of 65 bpm. Predominant underlying rhythm was Sinus Rhythm. 4 Supraventricular Tachycardia runs occurred, the run with the fastest interval lasting 4 beats with a max rate of 193 bpm, the longest lasting 7 beats with an avg rate of 112 bpm.    Isolated SVEs were rare (<1.0%), SVE Couplets were rare (<1.0%), and SVE Triplets were rare (<1.0%).    Isolated VEs were rare (<1.0%), and no VE Couplets or VE Triplets were present.    One triggered event >> sinus rhythm    09/20/2011: TTE Left ventricle: The cavity size was normal. Wall thickness  was normal. Systolic function was normal. The estimated   ejection fraction was in the range of 60% to 65%.    Transthoracic echocardiography.  M-mode, complete 2D,  spectral Doppler, and color Doppler.  Height:  Height:  152.4cm. Height: 60in.  Weight:  Weight: 52.6kg. Weight:  115.8lb.  Body mass index:  BMI: 22.7kg/m^2.  Body surface  area:    BSA: 1.100m^2.  Blood pressure:     110/75.  Patient  status:  Outpatient.  Location:  [facility]    Recent Labs: 08/29/2021: BUN 10; Creatinine, Ser 0.68; Hemoglobin 12.7; Platelets 257; Potassium 3.2; Sodium 139  No results found for requested labs within last 365 days.   CrCl cannot be calculated (Patient's most recent lab result is older than the maximum 21 days allowed.).   Wt Readings from  Last 3 Encounters:  08/29/21 137 lb (62.1 kg)  07/17/21 139 lb 9.6 oz (63.3 kg)  02/10/21 137 lb (62.1 kg)     Other studies reviewed: Additional studies/records reviewed today include: summarized above  ASSESSMENT AND PLAN:  Palpitations There is mention of AFib, though Dr. Lovena Le noted no documentation No change in behavior None on her monitor Symptom episode was SR  Discussed if any increase in the behavior, duration, symptoms to let us know Might consider loop if more  HTN Looks good  Syncope Remotely felt to be vasovagal/dysautonomia She has had excellent improvement in her symptoms with increased sodium and water intake at the recommendation of Dr. Caryl Comes years ago Goes back to her teens, none since her last visit Could consider stopping HCTZ if symptoms flare up We discussed liquid IV or compression wear if needed, though again, symptoms seem well controlled   Disposition: F/u with [place] annually, sooner again if needed   Current medicines are reviewed at length with the patient today.  The patient did not have any concerns regarding medicines.  Venetia Night, PA-C 04/15/2022 7:47 AM     [address] [place] [address] [phone] (office)  [phone] (fax)

## 2022-04-16 ENCOUNTER — Ambulatory Visit: Payer: BC Managed Care – PPO | Attending: Physician Assistant | Admitting: Physician Assistant

## 2022-04-16 ENCOUNTER — Encounter: Payer: Self-pay | Admitting: Physician Assistant

## 2022-04-16 VITALS — BP 130/70 | HR 56 | Ht 59.0 in | Wt 139.6 lb

## 2022-04-16 DIAGNOSIS — I10 Essential (primary) hypertension: Secondary | ICD-10-CM

## 2022-04-16 DIAGNOSIS — R55 Syncope and collapse: Secondary | ICD-10-CM | POA: Diagnosis not present

## 2022-04-16 DIAGNOSIS — R002 Palpitations: Secondary | ICD-10-CM | POA: Diagnosis not present

## 2022-04-16 NOTE — Patient Instructions (Signed)
Medication Instructions:    Your physician recommends that you continue on your current medications as directed. Please refer to the Current Medication list given to you today.  *If you need a refill on your cardiac medications before your next appointment, please call your pharmacy*   Lab Work:  NONE ORDERED  TODAY    If you have labs (blood work) drawn today and your tests are completely normal, you will receive your results only by: MyChart Message (if you have MyChart) OR A paper copy in the mail If you have any lab test that is abnormal or we need to change your treatment, we will call you to review the results.   Testing/Procedures: NONE ORDERED  TODAY    Follow-Up: At [facility], you and your health needs are our priority.  As part of our continuing mission to provide you with exceptional heart care, we have created designated Provider Care Teams.  These Care Teams include your primary Cardiologist (physician) and Advanced Practice Providers (APPs -  Physician Assistants and Nurse Practitioners) who all work together to provide you with the care you need, when you need it.  We recommend signing up for the patient portal called "MyChart".  Sign up information is provided on this After Visit Summary.  MyChart is used to connect with patients for Virtual Visits (Telemedicine).  Patients are able to view lab/test results, encounter notes, upcoming appointments, etc.  Non-urgent messages can be sent to your provider as well.   To learn more about what you can do with MyChart, go to [email or web address].    Your next appointment:   1 year(s)  The format for your next appointment:   In Person  Provider:   You may see None or one of the following Advanced Practice Providers on your designated Care Team:   Francis Dowse, [place] Casimiro Needle "Mardelle Matte" Lanna Poche, [place]  Other Instructions   Important Information About Sugar

## 2022-04-22 DIAGNOSIS — M2569 Stiffness of other specified joint, not elsewhere classified: Secondary | ICD-10-CM | POA: Diagnosis not present

## 2022-04-22 DIAGNOSIS — M542 Cervicalgia: Secondary | ICD-10-CM | POA: Diagnosis not present

## 2022-04-22 DIAGNOSIS — M25512 Pain in left shoulder: Secondary | ICD-10-CM | POA: Diagnosis not present

## 2022-04-22 DIAGNOSIS — M25511 Pain in right shoulder: Secondary | ICD-10-CM | POA: Diagnosis not present

## 2022-04-27 DIAGNOSIS — M2569 Stiffness of other specified joint, not elsewhere classified: Secondary | ICD-10-CM | POA: Diagnosis not present

## 2022-04-27 DIAGNOSIS — M542 Cervicalgia: Secondary | ICD-10-CM | POA: Diagnosis not present

## 2022-04-27 DIAGNOSIS — M25512 Pain in left shoulder: Secondary | ICD-10-CM | POA: Diagnosis not present

## 2022-04-27 DIAGNOSIS — M25511 Pain in right shoulder: Secondary | ICD-10-CM | POA: Diagnosis not present

## 2022-05-04 DIAGNOSIS — M2569 Stiffness of other specified joint, not elsewhere classified: Secondary | ICD-10-CM | POA: Diagnosis not present

## 2022-05-04 DIAGNOSIS — M25512 Pain in left shoulder: Secondary | ICD-10-CM | POA: Diagnosis not present

## 2022-05-04 DIAGNOSIS — M542 Cervicalgia: Secondary | ICD-10-CM | POA: Diagnosis not present

## 2022-05-04 DIAGNOSIS — M25511 Pain in right shoulder: Secondary | ICD-10-CM | POA: Diagnosis not present

## 2022-05-06 DIAGNOSIS — M2569 Stiffness of other specified joint, not elsewhere classified: Secondary | ICD-10-CM | POA: Diagnosis not present

## 2022-05-06 DIAGNOSIS — M25512 Pain in left shoulder: Secondary | ICD-10-CM | POA: Diagnosis not present

## 2022-05-06 DIAGNOSIS — M25511 Pain in right shoulder: Secondary | ICD-10-CM | POA: Diagnosis not present

## 2022-05-06 DIAGNOSIS — M542 Cervicalgia: Secondary | ICD-10-CM | POA: Diagnosis not present

## 2022-05-27 DIAGNOSIS — N898 Other specified noninflammatory disorders of vagina: Secondary | ICD-10-CM | POA: Diagnosis not present

## 2022-05-27 DIAGNOSIS — F4321 Adjustment disorder with depressed mood: Secondary | ICD-10-CM | POA: Diagnosis not present

## 2022-05-27 DIAGNOSIS — M62838 Other muscle spasm: Secondary | ICD-10-CM | POA: Diagnosis not present

## 2022-05-27 DIAGNOSIS — F320 Major depressive disorder, single episode, mild: Secondary | ICD-10-CM | POA: Diagnosis not present

## 2022-06-10 DIAGNOSIS — E039 Hypothyroidism, unspecified: Secondary | ICD-10-CM | POA: Diagnosis not present

## 2022-06-10 DIAGNOSIS — I10 Essential (primary) hypertension: Secondary | ICD-10-CM | POA: Diagnosis not present

## 2022-06-10 DIAGNOSIS — E785 Hyperlipidemia, unspecified: Secondary | ICD-10-CM | POA: Diagnosis not present

## 2022-06-10 DIAGNOSIS — Z713 Dietary counseling and surveillance: Secondary | ICD-10-CM | POA: Diagnosis not present

## 2022-06-25 ENCOUNTER — Other Ambulatory Visit: Payer: Self-pay | Admitting: Obstetrics & Gynecology

## 2022-06-25 DIAGNOSIS — N6331 Unspecified lump in axillary tail of the right breast: Secondary | ICD-10-CM

## 2022-07-06 ENCOUNTER — Encounter: Payer: Self-pay | Admitting: Obstetrics & Gynecology

## 2022-07-09 ENCOUNTER — Other Ambulatory Visit: Payer: Self-pay | Admitting: Obstetrics & Gynecology

## 2022-07-09 ENCOUNTER — Ambulatory Visit
Admission: RE | Admit: 2022-07-09 | Discharge: 2022-07-09 | Disposition: A | Discharge status: Home / Self Care | Payer: BC Managed Care – PPO | Source: Ambulatory Visit | Attending: Obstetrics & Gynecology | Admitting: Obstetrics & Gynecology

## 2022-07-09 DIAGNOSIS — N6331 Unspecified lump in axillary tail of the right breast: Secondary | ICD-10-CM

## 2022-07-09 DIAGNOSIS — N6001 Solitary cyst of right breast: Secondary | ICD-10-CM | POA: Diagnosis not present

## 2022-07-09 DIAGNOSIS — R928 Other abnormal and inconclusive findings on diagnostic imaging of breast: Secondary | ICD-10-CM | POA: Diagnosis not present

## 2022-09-15 DIAGNOSIS — N912 Amenorrhea, unspecified: Secondary | ICD-10-CM | POA: Diagnosis not present

## 2022-09-15 DIAGNOSIS — I10 Essential (primary) hypertension: Secondary | ICD-10-CM | POA: Diagnosis not present

## 2022-09-15 DIAGNOSIS — E039 Hypothyroidism, unspecified: Secondary | ICD-10-CM | POA: Diagnosis not present

## 2022-09-15 DIAGNOSIS — Z7184 Encounter for health counseling related to travel: Secondary | ICD-10-CM | POA: Diagnosis not present

## 2022-09-15 DIAGNOSIS — F4321 Adjustment disorder with depressed mood: Secondary | ICD-10-CM | POA: Diagnosis not present

## 2022-09-16 ENCOUNTER — Other Ambulatory Visit: Payer: Self-pay | Admitting: Family Medicine

## 2022-09-16 DIAGNOSIS — N938 Other specified abnormal uterine and vaginal bleeding: Secondary | ICD-10-CM

## 2022-12-27 DIAGNOSIS — F320 Major depressive disorder, single episode, mild: Secondary | ICD-10-CM | POA: Diagnosis not present

## 2022-12-27 DIAGNOSIS — E785 Hyperlipidemia, unspecified: Secondary | ICD-10-CM | POA: Diagnosis not present

## 2022-12-27 DIAGNOSIS — R55 Syncope and collapse: Secondary | ICD-10-CM | POA: Diagnosis not present

## 2022-12-27 DIAGNOSIS — I10 Essential (primary) hypertension: Secondary | ICD-10-CM | POA: Diagnosis not present

## 2022-12-27 DIAGNOSIS — E039 Hypothyroidism, unspecified: Secondary | ICD-10-CM | POA: Diagnosis not present

## 2023-01-19 ENCOUNTER — Ambulatory Visit: Payer: BC Managed Care – PPO | Admitting: Podiatry

## 2023-04-15 DIAGNOSIS — E559 Vitamin D deficiency, unspecified: Secondary | ICD-10-CM | POA: Diagnosis not present

## 2023-04-15 DIAGNOSIS — I10 Essential (primary) hypertension: Secondary | ICD-10-CM | POA: Diagnosis not present

## 2023-04-15 DIAGNOSIS — E785 Hyperlipidemia, unspecified: Secondary | ICD-10-CM | POA: Diagnosis not present

## 2023-04-15 DIAGNOSIS — Z23 Encounter for immunization: Secondary | ICD-10-CM | POA: Diagnosis not present

## 2023-04-20 DIAGNOSIS — E785 Hyperlipidemia, unspecified: Secondary | ICD-10-CM | POA: Diagnosis not present

## 2023-04-20 DIAGNOSIS — I10 Essential (primary) hypertension: Secondary | ICD-10-CM | POA: Diagnosis not present

## 2023-04-20 DIAGNOSIS — R55 Syncope and collapse: Secondary | ICD-10-CM | POA: Diagnosis not present

## 2023-04-20 DIAGNOSIS — E039 Hypothyroidism, unspecified: Secondary | ICD-10-CM | POA: Diagnosis not present

## 2023-04-20 DIAGNOSIS — Z0000 Encounter for general adult medical examination without abnormal findings: Secondary | ICD-10-CM | POA: Diagnosis not present
